# Patient Record
Sex: Female | Born: 1985
Health system: Southern US, Community
[De-identification: ages and names within clinical notes are randomized; demographics above are authoritative.]

## PROBLEM LIST (undated history)

## (undated) ENCOUNTER — Emergency Department (HOSPITAL_BASED_OUTPATIENT_CLINIC_OR_DEPARTMENT_OTHER): Admission: EM | Payer: Self-pay | Source: Home / Self Care

## (undated) DIAGNOSIS — Z8489 Family history of other specified conditions: Secondary | ICD-10-CM

## (undated) DIAGNOSIS — F32A Depression, unspecified: Secondary | ICD-10-CM

## (undated) DIAGNOSIS — F419 Anxiety disorder, unspecified: Secondary | ICD-10-CM

## (undated) DIAGNOSIS — Z9889 Other specified postprocedural states: Secondary | ICD-10-CM

## (undated) DIAGNOSIS — F329 Major depressive disorder, single episode, unspecified: Secondary | ICD-10-CM

## (undated) DIAGNOSIS — R112 Nausea with vomiting, unspecified: Secondary | ICD-10-CM

## (undated) HISTORY — PX: MANDIBLE FRACTURE SURGERY: SHX706

## (undated) HISTORY — DX: Major depressive disorder, single episode, unspecified: F32.9

## (undated) HISTORY — PX: KNEE SURGERY: SHX244

## (undated) HISTORY — DX: Depression, unspecified: F32.A

## (undated) HISTORY — PX: NOSE SURGERY: SHX723

---
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
## 2005-04-18 ENCOUNTER — Ambulatory Visit (HOSPITAL_BASED_OUTPATIENT_CLINIC_OR_DEPARTMENT_OTHER): Admission: RE | Admit: 2005-04-18 | Discharge: 2005-04-19 | Payer: Self-pay | Admitting: Unknown Physician Specialty

## 2006-08-19 ENCOUNTER — Emergency Department (HOSPITAL_COMMUNITY): Admission: EM | Admit: 2006-08-19 | Discharge: 2006-08-19 | Payer: Self-pay | Admitting: Emergency Medicine

## 2008-03-23 ENCOUNTER — Encounter: Admission: RE | Admit: 2008-03-23 | Discharge: 2008-03-23 | Payer: Self-pay | Admitting: Family Medicine
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
## 2009-10-07 ENCOUNTER — Other Ambulatory Visit: Admission: RE | Admit: 2009-10-07 | Discharge: 2009-10-07 | Payer: Self-pay | Admitting: Obstetrics and Gynecology

## 2010-04-19 ENCOUNTER — Inpatient Hospital Stay (HOSPITAL_COMMUNITY)
Admission: AD | Admit: 2010-04-19 | Discharge: 2010-04-19 | Disposition: A | Discharge status: Home / Self Care | Payer: BC Managed Care – PPO | Source: Ambulatory Visit | Attending: Obstetrics and Gynecology | Admitting: Obstetrics and Gynecology

## 2010-04-19 DIAGNOSIS — O479 False labor, unspecified: Secondary | ICD-10-CM | POA: Insufficient documentation

## 2010-04-27 ENCOUNTER — Inpatient Hospital Stay (HOSPITAL_COMMUNITY)
Admission: AD | Admit: 2010-04-27 | Discharge: 2010-04-29 | DRG: 373 | Disposition: A | Discharge status: Home / Self Care | Payer: BC Managed Care – PPO | Source: Ambulatory Visit | Attending: Obstetrics and Gynecology | Admitting: Obstetrics and Gynecology

## 2010-04-27 ENCOUNTER — Inpatient Hospital Stay (HOSPITAL_COMMUNITY)
Admission: AD | Admit: 2010-04-27 | Discharge: 2010-04-27 | Disposition: A | Discharge status: Home / Self Care | Payer: BC Managed Care – PPO | Source: Ambulatory Visit | Attending: Obstetrics and Gynecology | Admitting: Obstetrics and Gynecology

## 2010-04-27 DIAGNOSIS — O479 False labor, unspecified: Secondary | ICD-10-CM | POA: Insufficient documentation

## 2010-04-27 LAB — CBC
HCT: 37.3 % (ref 36.0–46.0)
Hemoglobin: 12.5 g/dL (ref 12.0–15.0)
WBC: 12.8 10*3/uL — ABNORMAL HIGH (ref 4.0–10.5)

## 2010-04-27 LAB — ABO/RH: ABO/RH(D): O POS

## 2010-04-28 LAB — CBC
HCT: 35.4 % — ABNORMAL LOW (ref 36.0–46.0)
Hemoglobin: 11.8 g/dL — ABNORMAL LOW (ref 12.0–15.0)
WBC: 14.9 10*3/uL — ABNORMAL HIGH (ref 4.0–10.5)

## 2010-06-03 NOTE — Op Note (Signed)
NAMEIMONIE, TUCH              ACCOUNT NO.:  [id number]   MEDICAL RECORD NO.:  [id number]          PATIENT TYPE:  AMB   LOCATION:  DSC                          FACILITY:  [facility]   PHYSICIAN:  Hermelinda Medicus, M.D.   DATE OF BIRTH:  [birth year]   DATE OF PROCEDURE:  DATE OF DISCHARGE:                                 OPERATIVE REPORT   PREOPERATIVE DIAGNOSIS:  Severe nasal deviation secondary to trauma with  septal deviation, with nasal obstruction 100% on the right and 50% on the  left, with severe columella deviation, with associated history of trauma.   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   OPERATION:  Nasal and septal reconstruction, turbinate reduction.   OPERATOR:  Dr. Hermelinda Medicus.   ANESTHESIA:  General endotracheal with Dr. Jean Rosenthal.   PROCEDURE:  Patient placed in the supine position under general orotracheal  anesthesia, was prepped and draped in the usual manner and using the usual  head drape. The topical cocaine 200 mg and 1% Xylocaine with epinephrine was  also used for hemostasis and for anesthesia purposes. A hemitransfixion  incision was made on the left side, carried around the columella to the  right side and a drooping columella was immediately approached and trimmed.  The quadrilateral cartilage was elevated back to the ethmoid septal  deviation and then a strip of posterior quadrilateral cartilage was taken as  well as off the floor of the nose as it was totally off the premaxillary  crest.  The ethmoid and vomerine septal deviation was pushed way over to the  right and the open and close Jansen-Middletons were used to address this  problem, leaving a full dorsal support the septum. Once this was brought  back to the midline and the septum was brought back to the midline,  the  columella and the tip of the nose began to come back to the midline toward  the right and we mobilized the columella. We then closed this  hemitransfixion incision which had been extended into a intercartilaginous  incision for our dorsal work and for fracturing the nose and taking down  this large dorsal hump.  The closure was with 5-0 plain catgut and a through-  and-through septal suture x2 using 4-0 plain. The inferior turbinates were  aggressively outfractured and the Elmed bipolar cautery was used to  cauterize the inferior turbinates to shrink these further.  Once this was  completed, then the dorsum was approached and the upper lateral cartilages  were exposed and trimmed of returning.  They were separated from the septum  as this was the only way we could bring the nose over to the midline and  this was completed and then the upper lateral cartilages were trimmed along  the dorsal aspect.  The 8-mm chisel was used to take down the dorsal hump  and to prepare to do our lateral osteotomies. These were rasped with normal  contour.  Then  the lateral osteotomies were completed using the 4 mm chisel  through pyriform incisions and the nasal bones were brought back to the  midline and also making the nose take a more normal appearance. Once this  was completed, the septum fit to the nose.  The nasal septum and the nose  was in the midline and then the closure was further completed with 5-0 plain  catgut of the intercartilaginous incisions.  The pyriforms were left open.  Intranasal dressing was placed.  Extra nasal cast was placed. The patient  tolerated the procedure very well and is doing well postoperatively. Follow-  up will be tomorrow, then on Friday, then three days, then in five days,  then in one week and then in three weeks and four weeks, six weeks and then  three months and six months and a year.       JC/MEDQ  D:  07/26/2004  T:  07/26/2004  Job:  [id number]   cc:    Marlin Canary, M.D.

## 2010-06-03 NOTE — Op Note (Signed)
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   PROCEDURE:  Bilateral sagittal osteotomies of the mandible with internal  screw fixation.   ANESTHESIA:  General.   SURGEON:  Hinton Dyer, D.D.S.   ASSISTANT:  Angelia Mould.  Montel Culver.   ESTIMATED BLOOD LOSS:  500 cc.   CONDITION END OF SURGERY:  Good.   DESCRIPTION OF PROCEDURE:  Following preoperative medication, the patient  was brought to the operating room in a supine position in which she remained  throughout the whole procedure.  She was intubated via a right nasal  endotracheal tube and then turned 90 degrees to the anesthesia cart.  She  was then prepped and draped in the usual fashion for an orthognathic  procedure.  After preparing the mouth with Betadine scrub and paint and  packing off the throat with an open moist 4 x 4 gauze, she was given  bilateral blocks using 2% Xylocaine with 1:100,000 epinephrine.  A #15 blade  then made an incision on the ascending ramus in the area of the third molar,  approximately 2.5 cm in length.  A full-thickness mucoperiosteal flap was  elevated with a periosteal elevator.  The medial aspect of the mandible was  freed up with a periosteal elevator.  The sigmoid notch was located with a  curved curet.  A reciprocating saw was then used to make a cut several  millimeters above the lingula, approximately halfway through the mandible.  A 701 cross cut  Fisher bur then made bur holes down the ascending ramus to  the first-second molar region.  These bur holes were then connected together  with the same bur.  Once this was finished, a periosteal elevator reflected  a flap down to the inferior border in the first-second molar region.  An  inferior border retractor was then placed around the inferior border and a  556 cross cut Fisher bur was then used to make a cut from the inferior  border to the original osteotomy site.  This osteotomy site was now widened  with increasing size hand osteotomes tapped into position and then a Smith  spread and Market researcher were used to separate the mandible.  The nerve was  found to be completely intact within the body of the segment containing the  teeth, now referred to as the distal segment.  A J stripper went along the  inferior border to free up the inferior border.  The condyle was checked and  found to be in the fossa.  The area was irrigated out and packed off.  The  left side was then addressed and #15 blade made an incision over the  ascending ramus, approximately 2.5 cm in length.  Again, a full-thickness  mucoperiosteal flap was elevated with a periosteal elevator.  The medial  aspect was reflected using a periosteal elevator and the sigmoid notch was  located with a curved curet.  A reciprocating saw made a cut 5 or 6 mm above  the lingula and halfway through the mandible.  A 701 cross cut Fisher bur  then made bur holes down the ascending ramus to the first-second molar  region and, like the right side, these bur holes were kept on the buccal  aspect.  The bur holes were then connected together.  A periosteal elevator  reflected a full-thickness flap down to the inferior border in the first-  second molar region and a 556 cross cut Fisher bur made a cut extending from  the inferior border to the original osteotomy site.  The osteotomy site was  then widened with increasing size hand  osteotomes and then the mandible was  split using a mid spread and elevator.  The nerve was found to be intact and  located within the distal segment of the mandible.  A J stripper reflected  the soft tissues around the inferior border so as to allow easy movement of  the mandible.  Once free, the mandible was placed in the Kerlix splint that  was made on the preoperative bottles and the occlusion was locked down into  position using 26-gauge stainless steel wires.  A large Allis clamp was then  placed on the patient's right side and the condyle was seated directly into  the fossa using posterior and superior pressure.  The Allis clamp was then  closed off.  Three screws were then placed into the bone by drilling the  holes first and then placing self-tapping screws.  The first two were 13 mm  in length and the third one was 11 mm in length.  All three were found to be  extremely tight.  When the Allis clamp was released, an attempt was made to  separate the mandible and it was found to be extremely tight.  It was also  irrigated out prior to placing the screws.  On the patient's left side, an  Allis clamp was placed and, again, the condyle was placed in a posterior  superior direction.  The Allis clamp was closed off and three screws were  placed, again drilling the hole first with copious irrigation and using self-  tapping screws.  Two 13-mm screws were placed anteriorly and an 11-mm screw  was placed posteriorly.  All three screws were found to be tight and the  mandible could not be separated when the Allis clamp was released.  The  occlusion was checked and found to be excellent.  Therefore, the splint was  not removed and the patient was placed in her occlusion and wires were used  to hold her in intermaxillary fixation.  Prior to placing her in  intermaxillary fixation, the throat pack was also removed and the throat was irrigated out.  Prior to placing her into intermaxillary  fixation, both  incisions were also closed with multiple 4-0 Vicryl sutures in a running  horizontal mattress fashion.  The patient tolerated the procedure well.  A  head drape and pressure dressing was placed and the patient was extubated on  the table and returned to the recovery room in good condition.  ______________________________  Hinton Dyer, D.D.S.     JLM/MEDQ  D:  05/03/2005  T:  05/04/2005  Job:  [id number]
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
## 2010-12-07 ENCOUNTER — Other Ambulatory Visit: Payer: Self-pay | Admitting: Family Medicine

## 2010-12-07 DIAGNOSIS — E049 Nontoxic goiter, unspecified: Secondary | ICD-10-CM

## 2010-12-14 ENCOUNTER — Ambulatory Visit
Admission: RE | Admit: 2010-12-14 | Discharge: 2010-12-14 | Disposition: A | Discharge status: Home / Self Care | Payer: BC Managed Care – PPO | Source: Ambulatory Visit | Attending: Family Medicine | Admitting: Family Medicine

## 2010-12-14 DIAGNOSIS — E049 Nontoxic goiter, unspecified: Secondary | ICD-10-CM

## 2010-12-20 ENCOUNTER — Other Ambulatory Visit: Payer: Self-pay | Admitting: Family Medicine

## 2010-12-20 DIAGNOSIS — E041 Nontoxic single thyroid nodule: Secondary | ICD-10-CM

## 2011-06-14 ENCOUNTER — Other Ambulatory Visit: Payer: BC Managed Care – PPO

## 2012-02-05 ENCOUNTER — Other Ambulatory Visit: Payer: Self-pay | Admitting: Family Medicine

## 2012-02-05 DIAGNOSIS — E041 Nontoxic single thyroid nodule: Secondary | ICD-10-CM

## 2012-02-08 ENCOUNTER — Other Ambulatory Visit: Payer: Self-pay

## 2012-02-08 ENCOUNTER — Ambulatory Visit
Admission: RE | Admit: 2012-02-08 | Discharge: 2012-02-08 | Disposition: A | Discharge status: Home / Self Care | Payer: BC Managed Care – PPO | Source: Ambulatory Visit | Attending: Family Medicine | Admitting: Family Medicine

## 2012-02-08 DIAGNOSIS — E041 Nontoxic single thyroid nodule: Secondary | ICD-10-CM

## 2012-05-15 ENCOUNTER — Other Ambulatory Visit (HOSPITAL_COMMUNITY)
Admission: RE | Admit: 2012-05-15 | Discharge: 2012-05-15 | Disposition: A | Discharge status: Home / Self Care | Payer: BC Managed Care – PPO | Source: Ambulatory Visit | Attending: Obstetrics and Gynecology | Admitting: Obstetrics and Gynecology

## 2012-05-15 ENCOUNTER — Other Ambulatory Visit: Payer: Self-pay | Admitting: Obstetrics and Gynecology

## 2012-05-15 DIAGNOSIS — Z01419 Encounter for gynecological examination (general) (routine) without abnormal findings: Secondary | ICD-10-CM | POA: Insufficient documentation

## 2014-05-29 ENCOUNTER — Other Ambulatory Visit: Payer: Self-pay | Admitting: Physician Assistant

## 2014-05-29 DIAGNOSIS — M799 Soft tissue disorder, unspecified: Secondary | ICD-10-CM

## 2014-06-09 ENCOUNTER — Ambulatory Visit
Admission: RE | Admit: 2014-06-09 | Discharge: 2014-06-09 | Disposition: A | Discharge status: Home / Self Care | Payer: BLUE CROSS/BLUE SHIELD | Source: Ambulatory Visit | Attending: Physician Assistant | Admitting: Physician Assistant

## 2014-06-09 DIAGNOSIS — M799 Soft tissue disorder, unspecified: Secondary | ICD-10-CM

## 2014-09-22 ENCOUNTER — Emergency Department (HOSPITAL_BASED_OUTPATIENT_CLINIC_OR_DEPARTMENT_OTHER): Payer: BLUE CROSS/BLUE SHIELD

## 2014-09-22 ENCOUNTER — Emergency Department (HOSPITAL_BASED_OUTPATIENT_CLINIC_OR_DEPARTMENT_OTHER)
Admission: EM | Admit: 2014-09-22 | Discharge: 2014-09-22 | Disposition: A | Discharge status: Home / Self Care | Payer: BLUE CROSS/BLUE SHIELD | Source: Home / Self Care | Attending: Emergency Medicine | Admitting: Emergency Medicine

## 2014-09-22 ENCOUNTER — Encounter (HOSPITAL_BASED_OUTPATIENT_CLINIC_OR_DEPARTMENT_OTHER): Payer: Self-pay

## 2014-09-22 DIAGNOSIS — L03115 Cellulitis of right lower limb: Secondary | ICD-10-CM | POA: Diagnosis not present

## 2014-09-22 DIAGNOSIS — M7989 Other specified soft tissue disorders: Secondary | ICD-10-CM | POA: Diagnosis not present

## 2014-09-22 DIAGNOSIS — L02415 Cutaneous abscess of right lower limb: Secondary | ICD-10-CM

## 2014-09-22 DIAGNOSIS — M79604 Pain in right leg: Secondary | ICD-10-CM

## 2014-09-22 LAB — CBC WITH DIFFERENTIAL/PLATELET
BASOS ABS: 0 10*3/uL (ref 0.0–0.1)
BASOS PCT: 0 % (ref 0–1)
Eosinophils Absolute: 0.1 10*3/uL (ref 0.0–0.7)
Eosinophils Relative: 1 % (ref 0–5)
HEMATOCRIT: 42.9 % (ref 36.0–46.0)
HEMOGLOBIN: 14.6 g/dL (ref 12.0–15.0)
Lymphocytes Relative: 16 % (ref 12–46)
Lymphs Abs: 2 10*3/uL (ref 0.7–4.0)
MCH: 31 pg (ref 26.0–34.0)
MCHC: 34 g/dL (ref 30.0–36.0)
MCV: 91.1 fL (ref 78.0–100.0)
MONO ABS: 0.9 10*3/uL (ref 0.1–1.0)
Monocytes Relative: 7 % (ref 3–12)
NEUTROS ABS: 9.9 10*3/uL — AB (ref 1.7–7.7)
NEUTROS PCT: 76 % (ref 43–77)
Platelets: 177 10*3/uL (ref 150–400)
RBC: 4.71 MIL/uL (ref 3.87–5.11)
RDW: 11.8 % (ref 11.5–15.5)
WBC: 12.9 10*3/uL — AB (ref 4.0–10.5)

## 2014-09-22 LAB — COMPREHENSIVE METABOLIC PANEL
ALBUMIN: 4.6 g/dL (ref 3.5–5.0)
ALT: 15 U/L (ref 14–54)
AST: 22 U/L (ref 15–41)
Alkaline Phosphatase: 51 U/L (ref 38–126)
Anion gap: 9 (ref 5–15)
BILIRUBIN TOTAL: 0.7 mg/dL (ref 0.3–1.2)
BUN: 7 mg/dL (ref 6–20)
CO2: 28 mmol/L (ref 22–32)
Calcium: 9.4 mg/dL (ref 8.9–10.3)
Chloride: 103 mmol/L (ref 101–111)
Creatinine, Ser: 0.7 mg/dL (ref 0.44–1.00)
GFR calc Af Amer: >60 mL/min (ref >60)
GFR calc non Af Amer: >60 mL/min (ref >60)
GLUCOSE: 91 mg/dL (ref 65–99)
POTASSIUM: 4 mmol/L (ref 3.5–5.1)
Sodium: 140 mmol/L (ref 135–145)
TOTAL PROTEIN: 8.2 g/dL — AB (ref 6.5–8.1)

## 2014-09-22 LAB — C-REACTIVE PROTEIN: CRP: 1.4 mg/dL — ABNORMAL HIGH (ref <1.0)

## 2014-09-22 LAB — SEDIMENTATION RATE: Sed Rate: 5 mm/hr (ref 0–22)

## 2014-09-22 MED ORDER — HYDROCODONE-ACETAMINOPHEN 5-325 MG PO TABS: 1.0000 | ORAL_TABLET | ORAL | Status: DC | PRN

## 2014-09-22 MED ORDER — CLINDAMYCIN HCL 150 MG PO CAPS: 150.0000 mg | ORAL_CAPSULE | Freq: Four times a day (QID) | ORAL | Status: DC

## 2014-09-22 MED ORDER — CLINDAMYCIN HCL 150 MG PO CAPS: 300.0000 mg | ORAL_CAPSULE | Freq: Four times a day (QID) | ORAL | Status: DC

## 2014-09-22 MED ORDER — HYDROMORPHONE HCL 1 MG/ML IJ SOLN
1.0000 mg | Freq: Once | INTRAMUSCULAR | Status: AC
  Administered 2014-09-22: 1 mg via INTRAVENOUS
  Filled 2014-09-22: qty 1

## 2014-09-22 MED ORDER — IOHEXOL 300 MG/ML  SOLN
100.0000 mL | Freq: Once | INTRAMUSCULAR | Status: AC | PRN
  Administered 2014-09-22: 100 mL via INTRAVENOUS

## 2014-09-22 MED ORDER — NAPROXEN 500 MG PO TABS: 500.0000 mg | ORAL_TABLET | Freq: Two times a day (BID) | ORAL | Status: DC

## 2014-09-22 MED ORDER — LIDOCAINE HCL (PF) 1 % IJ SOLN
15.0000 mL | Freq: Once | INTRAMUSCULAR | Status: DC
  Filled 2014-09-22: qty 15

## 2014-09-22 MED ORDER — HYDROMORPHONE HCL 1 MG/ML IJ SOLN
INTRAMUSCULAR | Status: DC
Start: 2014-09-22 — End: 2014-09-22
  Filled 2014-09-22: qty 1

## 2014-09-22 MED ORDER — SODIUM CHLORIDE 0.9 % IV BOLUS (SEPSIS)
1000.0000 mL | Freq: Once | INTRAVENOUS | Status: AC
  Administered 2014-09-22: 1000 mL via INTRAVENOUS

## 2014-09-22 MED ORDER — HYDROMORPHONE HCL 1 MG/ML IJ SOLN
1.0000 mg | Freq: Once | INTRAMUSCULAR | Status: AC
  Administered 2014-09-22: 1 mg via INTRAVENOUS

## 2014-09-22 MED ORDER — MORPHINE SULFATE (PF) 4 MG/ML IV SOLN
4.0000 mg | Freq: Once | INTRAVENOUS | Status: AC
  Administered 2014-09-22: 4 mg via INTRAVENOUS
  Filled 2014-09-22: qty 1

## 2014-09-22 MED ORDER — ONDANSETRON HCL 4 MG/2ML IJ SOLN
4.0000 mg | Freq: Once | INTRAMUSCULAR | Status: AC
  Administered 2014-09-22: 4 mg via INTRAVENOUS
  Filled 2014-09-22: qty 2

## 2014-09-22 MED ORDER — HYDROMORPHONE HCL 1 MG/ML IJ SOLN
1.0000 mg | Freq: Once | INTRAMUSCULAR | Status: AC
Start: 2014-09-22 — End: 2014-09-22
  Administered 2014-09-22: 1 mg via INTRAVENOUS
  Filled 2014-09-22: qty 1

## 2014-09-22 MED ORDER — OXYCODONE-ACETAMINOPHEN 5-325 MG PO TABS: 2.0000 | ORAL_TABLET | ORAL | Status: DC | PRN

## 2014-09-22 NOTE — Discharge Instructions (Signed)

## 2014-09-22 NOTE — ED Notes (Signed)
Borders of abscess marked

## 2014-09-22 NOTE — ED Notes (Signed)
Patient transported to X-ray 

## 2014-09-22 NOTE — ED Provider Notes (Signed)
CSN: [id number]     Arrival date & time 09/22/14  1118 History   First MD Initiated Contact with Patient 09/22/14 1247     Chief Complaint  Patient presents with  . Cellulitis     (Consider location/radiation/quality/duration/timing/severity/associated sxs/prior Treatment) HPI Comments: Was on doxy, rifampin, last on abx 1.5 wk ago Not sure how it initially began ?bug bite, 1 mos ago Improved prior to last night Then last night suddenly pain much more severe, worse than childbirth, spreading pain down leg and to knee, every 30sec kinife jabbing   Patient is a 29 y.o. female presenting with abscess.  Abscess Location:  Leg Leg abscess location:  R leg and R lower leg Size:  4cm Abscess quality: fluctuance, induration, painful, redness and warmth   Red streaking: yes   Duration: began 1 mo ago, was on abx then improved, last abx 1.5wk ago, worsened late last night, new redness and pain. Progression:  Worsening Pain details:    Quality:  Sharp   Severity:  Severe   Duration:  1 day   Timing:  Constant   Progression:  Worsening Chronicity:  New Context: not diabetes, not immunosuppression and not injected drug use   Relieved by:  Nothing Worsened by:  Nothing tried Ineffective treatments:  None tried Associated symptoms: nausea   Associated symptoms: no fever, no headaches and no vomiting   Risk factors: family hx of MRSA     History reviewed. No pertinent past medical history. Past Surgical History  Procedure Laterality Date  . Knee surgery    . Mandible fracture surgery    . Nose surgery     No family history on file. Social History  Substance Use Topics  . Smoking status: Current Every Day Smoker  . Smokeless tobacco: None  . Alcohol Use: No   OB History    No data available     Review of Systems  Constitutional: Negative for fever and chills.  HENT: Negative for sore throat.   Eyes: Negative for visual disturbance.  Respiratory: Negative for cough and  shortness of breath.   Cardiovascular: Negative for chest pain.  Gastrointestinal: Positive for nausea. Negative for vomiting, abdominal pain and constipation.  Genitourinary: Negative for difficulty urinating.  Musculoskeletal: Positive for myalgias and arthralgias. Negative for back pain and neck pain.  Skin: Negative for rash.  Neurological: Negative for syncope and headaches.      Allergies  Vicodin  Home Medications   Prior to Admission medications   Medication Sig Start Date End Date Taking? Authorizing Provider  clindamycin (CLEOCIN) 150 MG capsule Take 2 capsules (300 mg total) by mouth 4 (four) times daily. 09/22/14   Gareth Morgan, MD  HYDROcodone-acetaminophen (NORCO/VICODIN) 5-325 MG per tablet Take 1-2 tablets by mouth every 4 (four) hours as needed. 09/22/14   Gareth Morgan, MD  naproxen (NAPROSYN) 500 MG tablet Take 1 tablet (500 mg total) by mouth 2 (two) times daily. 09/22/14   Gareth Morgan, MD  oxyCODONE-acetaminophen (PERCOCET/ROXICET) 5-325 MG per tablet Take 2 tablets by mouth every 4 (four) hours as needed for severe pain. 09/22/14   Gareth Morgan, MD   BP 106/60 mmHg  Pulse 89  Temp(Src) 99.6 F (37.6 C) (Oral)  Resp 16  Ht _0  (1.651 m)  Wt 140 lb (63.504 kg)  BMI 23.30 kg/m2  SpO2 94% Physical Exam  Constitutional: She is oriented to person, place, and time. She appears well-developed and well-nourished. No distress.  HENT:  Head: Normocephalic and atraumatic.  Eyes: Conjunctivae and EOM are normal.  Neck: Normal range of motion.  Cardiovascular: Normal rate, regular rhythm, normal heart sounds and intact distal pulses.  Exam reveals no gallop and no friction rub.   No murmur heard. Pulmonary/Chest: Effort normal and breath sounds normal. No respiratory distress. She has no wheezes. She has no rales.  Abdominal: Soft. She exhibits no distension. There is no tenderness. There is no guarding.  Musculoskeletal: She exhibits no edema.       Right  lower leg: She exhibits tenderness and bony tenderness.  4cm area of erythema anterior medial tibia, central area of necrosis/scabbing No bullae No crepitance Extending area of tenderness inferiorly towards ankle   Neurological: She is alert and oriented to person, place, and time.  Skin: Skin is warm and dry. No rash noted. She is not diaphoretic. No erythema.  Nursing note and vitals reviewed.   ED Course  INCISION AND DRAINAGE Date/Time: 09/22/2014 6:19 PM Performed by: Gareth Morgan Authorized by: Gareth Morgan Consent: Verbal consent obtained. Risks and benefits: risks, benefits and alternatives were discussed Consent given by: patient Patient understanding: patient states understanding of the procedure being performed Patient consent: the patient's understanding of the procedure matches consent given Procedure consent: procedure consent matches procedure scheduled Relevant documents: relevant documents present and verified Test results: test results available and properly labeled Site marked: the operative site was marked Imaging studies: imaging studies available Required items: required blood products, implants, devices, and special equipment available Patient identity confirmed: verbally with patient Time out: Immediately prior to procedure a "time out" was called to verify the correct patient, procedure, equipment, support staff and site/side marked as required. Type: abscess Body area: lower extremity Location details: right leg Anesthesia: local infiltration Local anesthetic: lidocaine 1% with epinephrine Anesthetic total: 10 ml Patient sedated: no Scalpel size: 11 Incision type: single straight Incision depth: dermal Complexity: simple Drainage: purulent,  serous and  bloody Drainage amount: moderate Wound treatment: wound left open Packing material: 1/2 in iodoform gauze (1in iodoform) Patient tolerance: Patient tolerated the procedure well with no  immediate complications   (including critical care time) Labs Review Labs Reviewed  CBC WITH DIFFERENTIAL/PLATELET - Abnormal; Notable for the following:    WBC 12.9 (*)    Neutro Abs 9.9 (*)    All other components within normal limits  COMPREHENSIVE METABOLIC PANEL - Abnormal; Notable for the following:    Total Protein 8.2 (*)    All other components within normal limits  SEDIMENTATION RATE  C-REACTIVE PROTEIN    Imaging Review Dg Tibia/fibula Right  09/22/2014   CLINICAL DATA:  Continued pain and swelling about the right lower leg just below the knee since a insect bite 2 weeks ago. Initial encounter.  EXAM: RIGHT TIBIA AND FIBULA - 2 VIEW  COMPARISON:  None.  FINDINGS: No radiopaque foreign body or soft tissue gas collection is identified. Soft tissues are unremarkable. All imaged bones and joints appear normal.  IMPRESSION: Negative exam.   Electronically Signed   By: Inge Rise M.D.   On: 09/22/2014 13:57   Ct Tibia Fibula Right W Contrast  09/22/2014   CLINICAL DATA:  Insect bite on the upper leg 1 month ago. Worsening symptoms following antibiotic therapy. Pain extending down the leg.  EXAM: CT OF THE LOWER RIGHT EXTREMITY WITH CONTRAST  TECHNIQUE: Multidetector CT imaging of the RIGHT leg was performed according to the standard protocol following intravenous contrast administration.  COMPARISON:  09/22/2014.  CONTRAST:  168m OMNIPAQUE  IOHEXOL 300 MG/ML  SOLN  FINDINGS: A cutaneous abscess is present extending into the subcutaneous tissues of the proximal RIGHT leg. This is medial and anterior to the proximal tibial metaphysis and is probably palpable. This is in the area identified with cutaneous marker. The abscess measures 13 mm AP, 29 mm transverse and 42 mm craniocaudal. This demonstrates peripheral enhancement with central low attenuation, typical of abscess. No gas is present. Surrounding cellulitis is present. Neurovascular bundles are within normal limits. Negative for  osteomyelitis. Flexor and extensor tendons at the ankle appear normal. Marrow signal appears normal. Visible portions of the RIGHT knee are within normal limits.  IMPRESSION: Superficial proximal RIGHT leg medial cutaneous and subcutaneous abscess.   Electronically Signed   By: Dereck Ligas M.D.   On: 09/22/2014 16:30   I have personally reviewed and evaluated these images and lab results as part of my medical decision-making.   EKG Interpretation None      MDM   Final diagnoses:  Right leg pain  Cellulitis of right lower extremity  Abscess of right lower extremity   29 year old female with history of ongoing cellulitis over last month, with waxing and waning and about X, presents with onset of sudden increase worsening of her right lower extremity pain last night, with new worsening of cellulitis, and development of fluctuance.  Given patient's significant area of pain which was out of proportion to exam, pain and tenderness in distribution beyond area of cellulitis, CBC, CMP, ESR and CRP were ordered to evaluate for necrotizing fasciitis. Patient had mild leukocytosis of 12.9, normal CMP including normal sodium, normal creatinine, normal ESR of 5.  X-ray of the right X and he did not really feel any abnormalities, however given patient's pain, CT of her right tibia and fibula was ordered. CT did not reveal any signs of subcutaneous gas, with no CT evidence of necrotizing fasciitis or osteomyelitis. CT did show an abscess. Given patient's normal vital signs, and normal laboratory values and normal CT scan low suspicion this represents necrotizing fasciitis. Patient with subacute abscess which was incised and drained with moderate amount of purulence and blood expressed and wound was packed. Recommend 48 hour follow-up for wound check either with PCP or urgent care ED. Patient was given Percocet and naproxen for pain, and 10 days of clindamycin for surrounding cellulitis. Patient discharged in  stable condition with understanding of reasons to return.     Gareth Morgan, MD 09/22/14 1820

## 2014-09-22 NOTE — ED Notes (Signed)
MD at bedside. 

## 2014-09-22 NOTE — ED Notes (Signed)
Pt states she she had ?insect bite to right LE approx 1 month ago-she has been seen by PCP with 3 rounds of abx-last ended approx 1.5 week ago-area is now with increase redness with dark center since yesterday

## 2014-09-22 NOTE — ED Notes (Signed)
Patient transported to CT 

## 2014-09-23 ENCOUNTER — Encounter (HOSPITAL_COMMUNITY): Payer: Self-pay | Admitting: *Deleted

## 2014-09-23 ENCOUNTER — Inpatient Hospital Stay (HOSPITAL_COMMUNITY)
Admission: EM | Admit: 2014-09-23 | Discharge: 2014-09-26 | DRG: 603 | Disposition: A | Discharge status: Home / Self Care | Payer: BLUE CROSS/BLUE SHIELD | Attending: Internal Medicine | Admitting: Internal Medicine

## 2014-09-23 DIAGNOSIS — Z79891 Long term (current) use of opiate analgesic: Secondary | ICD-10-CM

## 2014-09-23 DIAGNOSIS — Z885 Allergy status to narcotic agent status: Secondary | ICD-10-CM | POA: Diagnosis not present

## 2014-09-23 DIAGNOSIS — L03115 Cellulitis of right lower limb: Principal | ICD-10-CM | POA: Diagnosis present

## 2014-09-23 DIAGNOSIS — F17210 Nicotine dependence, cigarettes, uncomplicated: Secondary | ICD-10-CM | POA: Diagnosis present

## 2014-09-23 DIAGNOSIS — R630 Anorexia: Secondary | ICD-10-CM | POA: Diagnosis present

## 2014-09-23 DIAGNOSIS — L02419 Cutaneous abscess of limb, unspecified: Secondary | ICD-10-CM | POA: Diagnosis not present

## 2014-09-23 DIAGNOSIS — L02415 Cutaneous abscess of right lower limb: Secondary | ICD-10-CM | POA: Diagnosis present

## 2014-09-23 DIAGNOSIS — Z791 Long term (current) use of non-steroidal anti-inflammatories (NSAID): Secondary | ICD-10-CM | POA: Diagnosis not present

## 2014-09-23 DIAGNOSIS — D696 Thrombocytopenia, unspecified: Secondary | ICD-10-CM | POA: Diagnosis present

## 2014-09-23 DIAGNOSIS — G43909 Migraine, unspecified, not intractable, without status migrainosus: Secondary | ICD-10-CM | POA: Diagnosis present

## 2014-09-23 DIAGNOSIS — Z79899 Other long term (current) drug therapy: Secondary | ICD-10-CM | POA: Diagnosis not present

## 2014-09-23 DIAGNOSIS — M7989 Other specified soft tissue disorders: Secondary | ICD-10-CM | POA: Diagnosis present

## 2014-09-23 DIAGNOSIS — K5900 Constipation, unspecified: Secondary | ICD-10-CM | POA: Diagnosis present

## 2014-09-23 DIAGNOSIS — L03119 Cellulitis of unspecified part of limb: Secondary | ICD-10-CM | POA: Diagnosis not present

## 2014-09-23 DIAGNOSIS — A4901 Methicillin susceptible Staphylococcus aureus infection, unspecified site: Secondary | ICD-10-CM | POA: Diagnosis present

## 2014-09-23 DIAGNOSIS — A419 Sepsis, unspecified organism: Secondary | ICD-10-CM | POA: Diagnosis not present

## 2014-09-23 DIAGNOSIS — B9561 Methicillin susceptible Staphylococcus aureus infection as the cause of diseases classified elsewhere: Secondary | ICD-10-CM | POA: Diagnosis not present

## 2014-09-23 LAB — CBC WITH DIFFERENTIAL/PLATELET
Basophils Absolute: 0 10*3/uL (ref 0.0–0.1)
Basophils Relative: 0 % (ref 0–1)
EOS PCT: 0 % (ref 0–5)
Eosinophils Absolute: 0 10*3/uL (ref 0.0–0.7)
HCT: 40.8 % (ref 36.0–46.0)
Hemoglobin: 14 g/dL (ref 12.0–15.0)
LYMPHS ABS: 1 10*3/uL (ref 0.7–4.0)
LYMPHS PCT: 10 % — AB (ref 12–46)
MCH: 31.7 pg (ref 26.0–34.0)
MCHC: 34.3 g/dL (ref 30.0–36.0)
MCV: 92.3 fL (ref 78.0–100.0)
MONO ABS: 0.7 10*3/uL (ref 0.1–1.0)
MONOS PCT: 7 % (ref 3–12)
Neutro Abs: 8.1 10*3/uL — ABNORMAL HIGH (ref 1.7–7.7)
Neutrophils Relative %: 83 % — ABNORMAL HIGH (ref 43–77)
PLATELETS: 139 10*3/uL — AB (ref 150–400)
RBC: 4.42 MIL/uL (ref 3.87–5.11)
RDW: 12 % (ref 11.5–15.5)
WBC: 9.8 10*3/uL (ref 4.0–10.5)

## 2014-09-23 LAB — COMPREHENSIVE METABOLIC PANEL
ALT: 21 U/L (ref 14–54)
AST: 25 U/L (ref 15–41)
Albumin: 4.1 g/dL (ref 3.5–5.0)
Alkaline Phosphatase: 54 U/L (ref 38–126)
Anion gap: 7 (ref 5–15)
BUN: 5 mg/dL — ABNORMAL LOW (ref 6–20)
CHLORIDE: 100 mmol/L — AB (ref 101–111)
CO2: 29 mmol/L (ref 22–32)
CREATININE: 0.73 mg/dL (ref 0.44–1.00)
Calcium: 9.2 mg/dL (ref 8.9–10.3)
Glucose, Bld: 103 mg/dL — ABNORMAL HIGH (ref 65–99)
POTASSIUM: 4.5 mmol/L (ref 3.5–5.1)
Sodium: 136 mmol/L (ref 135–145)
Total Bilirubin: 0.8 mg/dL (ref 0.3–1.2)
Total Protein: 8 g/dL (ref 6.5–8.1)

## 2014-09-23 LAB — CREATININE, SERUM
CREATININE: 0.72 mg/dL (ref 0.44–1.00)
GFR calc non Af Amer: >60 mL/min (ref >60)

## 2014-09-23 LAB — SEDIMENTATION RATE: SED RATE: 26 mm/h — AB (ref 0–22)

## 2014-09-23 MED ORDER — MORPHINE SULFATE (PF) 4 MG/ML IV SOLN
INTRAVENOUS | Status: AC
  Filled 2014-09-23: qty 1

## 2014-09-23 MED ORDER — ACETAMINOPHEN 650 MG RE SUPP: 650.0000 mg | Freq: Four times a day (QID) | RECTAL | Status: DC | PRN

## 2014-09-23 MED ORDER — ONDANSETRON HCL 4 MG PO TABS: 4.0000 mg | ORAL_TABLET | Freq: Four times a day (QID) | ORAL | Status: DC | PRN

## 2014-09-23 MED ORDER — MORPHINE SULFATE (PF) 4 MG/ML IV SOLN
4.0000 mg | Freq: Once | INTRAVENOUS | Status: AC
  Administered 2014-09-23: 4 mg via INTRAVENOUS
  Filled 2014-09-23: qty 1

## 2014-09-23 MED ORDER — ONDANSETRON HCL 4 MG/2ML IJ SOLN: 4.0000 mg | Freq: Four times a day (QID) | INTRAMUSCULAR | Status: DC | PRN

## 2014-09-23 MED ORDER — ALUM & MAG HYDROXIDE-SIMETH 200-200-20 MG/5ML PO SUSP: 30.0000 mL | Freq: Four times a day (QID) | ORAL | Status: DC | PRN

## 2014-09-23 MED ORDER — VANCOMYCIN HCL IN DEXTROSE 750-5 MG/150ML-% IV SOLN
750.0000 mg | Freq: Three times a day (TID) | INTRAVENOUS | Status: DC
  Administered 2014-09-24 – 2014-09-26 (×7): 750 mg via INTRAVENOUS
  Filled 2014-09-23 (×9): qty 150

## 2014-09-23 MED ORDER — PIPERACILLIN-TAZOBACTAM 3.375 G IVPB
3.3750 g | Freq: Three times a day (TID) | INTRAVENOUS | Status: DC
  Administered 2014-09-24 – 2014-09-25 (×4): 3.375 g via INTRAVENOUS
  Filled 2014-09-23 (×6): qty 50

## 2014-09-23 MED ORDER — DIPHENHYDRAMINE HCL 50 MG/ML IJ SOLN
25.0000 mg | Freq: Once | INTRAMUSCULAR | Status: AC
  Administered 2014-09-24: 25 mg via INTRAVENOUS
  Filled 2014-09-23: qty 1

## 2014-09-23 MED ORDER — SODIUM CHLORIDE 0.9 % IV BOLUS (SEPSIS)
1000.0000 mL | Freq: Once | INTRAVENOUS | Status: AC
  Administered 2014-09-23: 1000 mL via INTRAVENOUS

## 2014-09-23 MED ORDER — ENOXAPARIN SODIUM 40 MG/0.4ML ~~LOC~~ SOLN
40.0000 mg | SUBCUTANEOUS | Status: DC
  Administered 2014-09-23 – 2014-09-25 (×3): 40 mg via SUBCUTANEOUS
  Filled 2014-09-23 (×4): qty 0.4

## 2014-09-23 MED ORDER — VANCOMYCIN HCL IN DEXTROSE 1-5 GM/200ML-% IV SOLN
1000.0000 mg | Freq: Once | INTRAVENOUS | Status: AC
  Administered 2014-09-23: 1000 mg via INTRAVENOUS
  Filled 2014-09-23: qty 200

## 2014-09-23 MED ORDER — HYDROMORPHONE HCL 1 MG/ML IJ SOLN: 1.0000 mg | INTRAMUSCULAR | Status: DC | PRN

## 2014-09-23 MED ORDER — ONDANSETRON HCL 4 MG/2ML IJ SOLN
4.0000 mg | Freq: Once | INTRAMUSCULAR | Status: AC
  Administered 2014-09-23: 4 mg via INTRAVENOUS
  Filled 2014-09-23: qty 2

## 2014-09-23 MED ORDER — FAMOTIDINE IN NACL 20-0.9 MG/50ML-% IV SOLN
20.0000 mg | Freq: Once | INTRAVENOUS | Status: AC
  Administered 2014-09-23: 20 mg via INTRAVENOUS
  Filled 2014-09-23: qty 50

## 2014-09-23 MED ORDER — DIPHENHYDRAMINE HCL 50 MG/ML IJ SOLN
INTRAMUSCULAR | Status: AC
  Administered 2014-09-23: 21:00:00
  Filled 2014-09-23: qty 1

## 2014-09-23 MED ORDER — HYDROMORPHONE HCL 1 MG/ML IJ SOLN
1.0000 mg | Freq: Once | INTRAMUSCULAR | Status: AC
  Administered 2014-09-23: 1 mg via INTRAVENOUS
  Filled 2014-09-23: qty 1

## 2014-09-23 MED ORDER — METHYLPREDNISOLONE SODIUM SUCC 125 MG IJ SOLR
125.0000 mg | Freq: Once | INTRAMUSCULAR | Status: AC
  Administered 2014-09-23: 125 mg via INTRAVENOUS
  Filled 2014-09-23: qty 2

## 2014-09-23 MED ORDER — ELETRIPTAN HYDROBROMIDE 40 MG PO TABS
40.0000 mg | ORAL_TABLET | ORAL | Status: DC | PRN
  Filled 2014-09-23: qty 1

## 2014-09-23 MED ORDER — PIPERACILLIN-TAZOBACTAM 3.375 G IVPB 30 MIN
3.3750 g | Freq: Once | INTRAVENOUS | Status: AC
  Administered 2014-09-23: 3.375 g via INTRAVENOUS

## 2014-09-23 MED ORDER — ACETAMINOPHEN 325 MG PO TABS
650.0000 mg | ORAL_TABLET | Freq: Four times a day (QID) | ORAL | Status: DC | PRN
  Administered 2014-09-24 – 2014-09-26 (×4): 650 mg via ORAL
  Filled 2014-09-23 (×4): qty 2

## 2014-09-23 MED ORDER — OXYCODONE HCL 5 MG PO TABS
5.0000 mg | ORAL_TABLET | ORAL | Status: DC | PRN
  Administered 2014-09-24 (×3): 5 mg via ORAL
  Filled 2014-09-23 (×3): qty 1

## 2014-09-23 NOTE — Progress Notes (Signed)
Pt confirms pcp is wendy mcneil [facility] updated

## 2014-09-23 NOTE — ED Notes (Signed)
Pt states "seen at [facility] yesterday, it was opened.  I feel like I have the flu.  I've been on abx for a month.  Redness got worse after it was opened."  Pt presents with RLE bandaged.

## 2014-09-23 NOTE — ED Provider Notes (Signed)
CSN: [id number]     Arrival date & time 09/23/14  1543 History   First MD Initiated Contact with Patient 09/23/14 1641     Chief Complaint  Patient presents with  . Wound Infection     (Consider location/radiation/quality/duration/timing/severity/associated sxs/prior Treatment) HPI Margaret Woods is a 29 y.o. female who comes in for evaluation of wound infection. Patient states approximately one month ago she was bitten by an insect, and has had a progressive cellulitis over the front of her right shin. She was seen in the ED yesterday, had an IND done for an abscess and discharge with oral antibiotics (clindamycin). Patient states she has been taking the antibiotics, but the redness and swelling have progressed further down in upper leg. Worsening knifelike pain. She reports associated mild fevers at home of 100F. She also reports she feels like she has the flu with generalized body aches, nasal congestion and cough. She does report neck stiffness, specifically on the sides of her neck. Rates discomfort as severe. No other aggravating or modifying factors.  History reviewed. No pertinent past medical history. Past Surgical History  Procedure Laterality Date  . Knee surgery    . Mandible fracture surgery    . Nose surgery     No family history on file. Social History  Substance Use Topics  . Smoking status: Current Every Day Smoker  . Smokeless tobacco: None  . Alcohol Use: No   OB History    No data available     Review of Systems A 10 point review of systems was completed and was negative except for pertinent positives and negatives as mentioned in the history of present illness     Allergies  Vicodin  Home Medications   Prior to Admission medications   Medication Sig Start Date End Date Taking? Authorizing Provider  clindamycin (CLEOCIN) 150 MG capsule Take 2 capsules (300 mg total) by mouth 4 (four) times daily. 09/22/14  Yes Gareth Morgan, MD  eletriptan (RELPAX)  40 MG tablet Take 40 mg by mouth as needed for migraine or headache. May repeat in 2 hours if headache persists or recurs.   Yes Historical Provider, MD  oxyCODONE-acetaminophen (PERCOCET/ROXICET) 5-325 MG per tablet Take 2 tablets by mouth every 4 (four) hours as needed for severe pain. 09/22/14  Yes Gareth Morgan, MD  HYDROcodone-acetaminophen (NORCO/VICODIN) 5-325 MG per tablet Take 1-2 tablets by mouth every 4 (four) hours as needed. Patient not taking: Reported on 09/23/2014 09/22/14   Gareth Morgan, MD  naproxen (NAPROSYN) 500 MG tablet Take 1 tablet (500 mg total) by mouth 2 (two) times daily. 09/22/14   Gareth Morgan, MD   BP 110/71 mmHg  Pulse 91  Temp(Src) 98.7 F (37.1 C) (Oral)  Resp 18  Ht '5\' 5"'  (1.651 m)  Wt 140 lb (63.504 kg)  BMI 23.30 kg/m2  SpO2 94% Physical Exam  Constitutional: She is oriented to person, place, and time. She appears well-developed and well-nourished.  HENT:  Head: Normocephalic and atraumatic.  Mouth/Throat: Oropharynx is clear and moist.  Eyes: Conjunctivae are normal. Pupils are equal, round, and reactive to light. Right eye exhibits no discharge. Left eye exhibits no discharge. No scleral icterus.  Neck: Normal range of motion. Neck supple.  Patient does express discomfort on the bilateral sides of her neck, but no overt meningismus or nuchal rigidity.  Cardiovascular: Normal rate, regular rhythm and normal heart sounds.   Pulmonary/Chest: Effort normal and breath sounds normal. No respiratory distress. She has no wheezes.  She has no rales.  Abdominal: Soft. There is no tenderness.  Musculoskeletal: She exhibits no tenderness.  Neurological: She is alert and oriented to person, place, and time.  Cranial Nerves II-XII grossly intact  Skin: Skin is warm and dry. No rash noted.  Obvious progressive erythema from previously demarcated borders. Diffuse tenderness to calf and distal right thigh. No drainage coming from the wound, however there are  necrotic orders. See pictures for further detail.  Psychiatric: She has a normal mood and affect.  Nursing note and vitals reviewed.       ED Course  Procedures (including critical care time) Labs Review Labs Reviewed  CBC WITH DIFFERENTIAL/PLATELET - Abnormal; Notable for the following:    Platelets 139 (*)    Neutrophils Relative % 83 (*)    Neutro Abs 8.1 (*)    Lymphocytes Relative 10 (*)    All other components within normal limits  COMPREHENSIVE METABOLIC PANEL - Abnormal; Notable for the following:    Chloride 100 (*)    Glucose, Bld 103 (*)    BUN 5 (*)    All other components within normal limits  SEDIMENTATION RATE - Abnormal; Notable for the following:    Sed Rate 26 (*)    All other components within normal limits  WOUND CULTURE  WOUND CULTURE  CBC  CREATININE, SERUM  BASIC METABOLIC PANEL  CBC    Imaging Review Dg Tibia/fibula Right  09/22/2014   CLINICAL DATA:  Continued pain and swelling about the right lower leg just below the knee since a insect bite 2 weeks ago. Initial encounter.  EXAM: RIGHT TIBIA AND FIBULA - 2 VIEW  COMPARISON:  None.  FINDINGS: No radiopaque foreign body or soft tissue gas collection is identified. Soft tissues are unremarkable. All imaged bones and joints appear normal.  IMPRESSION: Negative exam.   Electronically Signed   By: Inge Rise M.D.   On: 09/22/2014 13:57   Ct Tibia Fibula Right W Contrast  09/22/2014   CLINICAL DATA:  Insect bite on the upper leg 1 month ago. Worsening symptoms following antibiotic therapy. Pain extending down the leg.  EXAM: CT OF THE LOWER RIGHT EXTREMITY WITH CONTRAST  TECHNIQUE: Multidetector CT imaging of the RIGHT leg was performed according to the standard protocol following intravenous contrast administration.  COMPARISON:  09/22/2014.  CONTRAST:  122m OMNIPAQUE IOHEXOL 300 MG/ML  SOLN  FINDINGS: A cutaneous abscess is present extending into the subcutaneous tissues of the proximal RIGHT leg.  This is medial and anterior to the proximal tibial metaphysis and is probably palpable. This is in the area identified with cutaneous marker. The abscess measures 13 mm AP, 29 mm transverse and 42 mm craniocaudal. This demonstrates peripheral enhancement with central low attenuation, typical of abscess. No gas is present. Surrounding cellulitis is present. Neurovascular bundles are within normal limits. Negative for osteomyelitis. Flexor and extensor tendons at the ankle appear normal. Marrow signal appears normal. Visible portions of the RIGHT knee are within normal limits.  IMPRESSION: Superficial proximal RIGHT leg medial cutaneous and subcutaneous abscess.   Electronically Signed   By: GDereck LigasM.D.   On: 09/22/2014 16:30   I have personally reviewed and evaluated these images and lab results as part of my medical decision-making.   EKG Interpretation None     Meds given in ED:  Medications  vancomycin (VANCOCIN) IVPB 1000 mg/200 mL premix (1,000 mg Intravenous New Bag/Given 09/23/14 1846)  enoxaparin (LOVENOX) injection 40 mg (not administered)  acetaminophen (TYLENOL) tablet 650 mg (not administered)    Or  acetaminophen (TYLENOL) suppository 650 mg (not administered)  ondansetron (ZOFRAN) tablet 4 mg (not administered)    Or  ondansetron (ZOFRAN) injection 4 mg (not administered)  alum & mag hydroxide-simeth (MAALOX/MYLANTA) 200-200-20 MG/5ML suspension 30 mL (not administered)  oxyCODONE (Oxy IR/ROXICODONE) immediate release tablet 5 mg (not administered)  HYDROmorphone (DILAUDID) injection 1 mg (not administered)  eletriptan (RELPAX) tablet 40 mg (not administered)  piperacillin-tazobactam (ZOSYN) IVPB 3.375 g (not administered)  ondansetron (ZOFRAN) injection 4 mg (4 mg Intravenous Given 09/23/14 1744)  sodium chloride 0.9 % bolus 1,000 mL (1,000 mLs Intravenous New Bag/Given 09/23/14 1744)  morphine 4 MG/ML injection 4 mg (4 mg Intravenous Given 09/23/14 1744)  morphine 4 MG/ML  injection (  Duplicate 02/19/13 9017)  HYDROmorphone (DILAUDID) injection 1 mg (1 mg Intravenous Given 09/23/14 1847)    New Prescriptions   No medications on file   Filed Vitals:   09/23/14 1619 09/23/14 1901  BP: 115/78 110/71  Pulse: 88 91  Temp: 98.7 F (37.1 C)   TempSrc: Oral   Resp:  18  Height: '5\' 5"'  (1.651 m)   Weight: 140 lb (63.504 kg)   SpO2: 98% 94%    MDM  Margaret Woods is a 29 y.o. female with no significant past medical history, no history of immunocompromise, comes in for evaluation of cellulitis refractory to outpatient clindamycin therapy. Wound culture was obtained. Basic labs including CBC, CMP, ESR obtained. Vital signs have remained stable, no leukocytosis. Discussed with hospitalist, Dr. Wynelle Cleveland to see in the ED. Admitted to medical service for IV antibiotics. Patient started on vancomycin therapy. Also discussed with my attending, Dr. Billy Fischer who also saw and evaluated the patient, will consult general surgery. Patient also started on Zosyn, per recommendations of general surgery. Final diagnoses:  Cellulitis and abscess of leg            Comer Locket, PA-C 09/23/14 1938  Gareth Morgan, MD 09/24/14 [phone]

## 2014-09-23 NOTE — ED Notes (Signed)
Initial dose of morphine was dropped on the floor. Dose was diluted in saline. Another dose pulled from the pyxis and asministered.

## 2014-09-23 NOTE — Progress Notes (Signed)
Utilization Review completed.  Gustin Zobrist RN CM  

## 2014-09-23 NOTE — ED Notes (Signed)
Second attempt for report, RN unavailable. Awaiting call

## 2014-09-23 NOTE — H&P (Addendum)
[facility] History and Physical  Margaret Woods ZOX:[id number] DOB: 05/16/1985 DOA: 09/23/2014   PCP: Gweneth Dimitri, MD    Chief Complaint: swelling and redness on right leg  HPI: Margaret Woods is a 29 y.o. female with h/o migraines who developed swelling and redness on her right leg about 3 wks ago. She was given 2 courses of Doxycycline 10 days each and a third course of Doxycycline and Rifampin for 7 days. The swelling and redness did not improve. About 2 days ago it began to increase in size. She was seen at the ER in [facility] yesterday, had the area lanced and was sent home with clindamycin. She states not much pus was removed and the area mostly drained blood. Overnight redness extended up to her knee and down to her ankle and she developed low-grade fevers and body aches and felt like she had the flu. She presented to the ER again for this reason. The packing is still in place but the ER PA states there was drainage from around the wound which she has cultured. The patient states that the pain is quite significant.   General: + anorexia, fever, no frequrent headaches  Cardiac: Denies chest pain, syncope, palpitations, pedal edema  Respiratory: Denies cough, shortness of breath, wheezing GI: Denies severe indigestion/heartburn, abdominal pain, nausea, vomiting, diarrhea and constipation GU: Denies hematuria, incontinence, dysuria  Musculoskeletal: right knee pain in association with current infection Skin: Denies suspicious skin lesions Neurologic: Denies focal weakness or numbness, change in vision Psychiatry: Denies depression or anxiety. Hematologic: no easy bruising or bleeding  All other systems reviewed and found to be negative.  History reviewed. No pertinent past medical history.  Past Surgical History  Procedure Laterality Date  . Knee surgery    . Mandible fracture surgery    . Nose surgery      Social History: Social History   Social  History  . Marital Status: Single    Spouse Name: N/A  . Number of Children: N/A  . Years of Education: N/A   Occupational History  . Not on file.   Social History Main Topics  . Smoking status: Current Every Day Smoker- less than 1/2 ppd- hasnt smoked in 2 days  . Smokeless tobacco: Not on file  . Alcohol Use: No  . Drug Use: No  . Sexual Activity: Yes    Birth Control/ Protection: IUD   Other Topics Concern  . Not on file   Social History Narrative     Allergies  Allergen Reactions  . Vicodin [Hydrocodone-Acetaminophen] Other (See Comments)    "It makes me paranoid and angry"    Family history:  No medical problems in family.     Prior to Admission medications   Medication Sig Start Date End Date Taking? Authorizing Provider  clindamycin (CLEOCIN) 150 MG capsule Take 2 capsules (300 mg total) by mouth 4 (four) times daily. 09/22/14  Yes Alvira Monday, MD  eletriptan (RELPAX) 40 MG tablet Take 40 mg by mouth as needed for migraine or headache. May repeat in 2 hours if headache persists or recurs.   Yes Historical Provider, MD  oxyCODONE-acetaminophen (PERCOCET/ROXICET) 5-325 MG per tablet Take 2 tablets by mouth every 4 (four) hours as needed for severe pain. 09/22/14  Yes Alvira Monday, MD  HYDROcodone-acetaminophen (NORCO/VICODIN) 5-325 MG per tablet Take 1-2 tablets by mouth every 4 (four) hours as needed. Patient not taking: Reported on 09/23/2014 09/22/14   Alvira Monday, MD  naproxen (NAPROSYN)  500 MG tablet Take 1 tablet (500 mg total) by mouth 2 (two) times daily. 09/22/14   Alvira Monday, MD     Physical Exam: Filed Vitals:   09/23/14 1619  BP: 115/78  Pulse: 88  Temp: 98.7 F (37.1 C)  TempSrc: Oral  Height: 5\' 5"  (1.651 m)  Weight: 63.504 kg (140 lb)  SpO2: 98%     General: Awake alert oriented 3, tearful, no acute distress HEENT: Normocephalic and Atraumatic, Mucous membranes pink                PERRLA; EOM intact; No scleral icterus,                  Nares: Patent, Oropharynx: Clear, Fair Dentition                 Neck: FROM, no cervical lymphadenopathy, thyromegaly, carotid bruit or JVD;  Breasts: deferred CHEST WALL: No tenderness  CHEST: Normal respiration, clear to auscultation bilaterally  HEART: Regular rate and rhythm; no murmurs rubs or gallops  BACK: No kyphosis or scoliosis; no CVA tenderness  GI: Positive Bowel Sounds, soft, non-tender; no masses, no organomegaly Rectal Exam: deferred MSK: No cyanosis, clubbing, or edema-  Erythema extending from left knee to left ankle. Ulcerated area below the left knee with packing in place. Skin around this area appears necrotic. Genitalia: not examined  SKIN:  no rash or ulceration  CNS: Alert and Oriented x 4, Nonfocal exam, CN 2-12 intact  Labs on Admission:  Basic Metabolic Panel:  Recent Labs Lab 09/22/14 1330 09/23/14 1719  NA 140 136  K 4.0 4.5  CL 103 100*  CO2 28 29  GLUCOSE 91 103*  BUN 7 5*  CREATININE 0.70 0.73  CALCIUM 9.4 9.2   Liver Function Tests:  Recent Labs Lab 09/22/14 1330 09/23/14 1719  AST 22 25  ALT 15 21  ALKPHOS 51 54  BILITOT 0.7 0.8  PROT 8.2* 8.0  ALBUMIN 4.6 4.1   No results for input(s): LIPASE, AMYLASE in the last 168 hours. No results for input(s): AMMONIA in the last 168 hours. CBC:  Recent Labs Lab 09/22/14 1330 09/23/14 1719  WBC 12.9* 9.8  NEUTROABS 9.9* 8.1*  HGB 14.6 14.0  HCT 42.9 40.8  MCV 91.1 92.3  PLT 177 139*   Cardiac Enzymes: No results for input(s): CKTOTAL, CKMB, CKMBINDEX, TROPONINI in the last 168 hours.  BNP (last 3 results) No results for input(s): BNP in the last 8760 hours.  ProBNP (last 3 results) No results for input(s): PROBNP in the last 8760 hours.  CBG: No results for input(s): GLUCAP in the last 168 hours.  Radiological Exams on Admission: Dg Tibia/fibula Right  09/22/2014   CLINICAL DATA:  Continued pain and swelling about the right lower leg just below the knee since a  insect bite 2 weeks ago. Initial encounter.  EXAM: RIGHT TIBIA AND FIBULA - 2 VIEW  COMPARISON:  None.  FINDINGS: No radiopaque foreign body or soft tissue gas collection is identified. Soft tissues are unremarkable. All imaged bones and joints appear normal.  IMPRESSION: Negative exam.   Electronically Signed   By: Drusilla Kanner M.D.   On: 09/22/2014 13:57   Ct Tibia Fibula Right W Contrast  09/22/2014   CLINICAL DATA:  Insect bite on the upper leg 1 month ago. Worsening symptoms following antibiotic therapy. Pain extending down the leg.  EXAM: CT OF THE LOWER RIGHT EXTREMITY WITH CONTRAST  TECHNIQUE: Multidetector CT imaging of  the RIGHT leg was performed according to the standard protocol following intravenous contrast administration.  COMPARISON:  09/22/2014.  CONTRAST:  OMNIPAQUE IOHEXOL 300 MG/ML  SOLN  FINDINGS: A cutaneous abscess is present extending into the subcutaneous tissues of the proximal RIGHT leg. This is medial and anterior to the proximal tibial metaphysis and is probably palpable. This is in the area identified with cutaneous marker. The abscess measures 13 mm AP, 29 mm transverse and 42 mm craniocaudal. This demonstrates peripheral enhancement with central low attenuation, typical of abscess. No gas is present. Surrounding cellulitis is present. Neurovascular bundles are within normal limits. Negative for osteomyelitis. Flexor and extensor tendons at the ankle appear normal. Marrow signal appears normal. Visible portions of the RIGHT knee are within normal limits.  IMPRESSION: Superficial proximal RIGHT leg medial cutaneous and subcutaneous abscess.   Electronically Signed   By: Andreas Newport M.D.   On: 09/22/2014 16:30     Assessment/Plan Principal Problem:   Cellulitis and abscess of leg, leukocytosis, fevers-sepsis -Rapid spread of cellulitis in the past 48 hours from a small circumscribed area below the knee to involving the entire lower leg -Failed 2 courses of  doxycycline and one course of doxycycline plus rifampin -We'll remove packing to see and see we can obtain more Pus sent for culture (patient not allowing me to remove it until she has received IV pain medication)-  wound was not cultured yesterday when it was drained initially - Start vancomycin- she is not aware of having any history of MRSA -WBC count has improved from 12-9 between yesterday and today -Tylenol for fevers -Dilaudid & oxycodone for pain   Active Problems:    Thrombocytopenia -Likely due to acute infection -follow  Anorexia -Also likely due to above-poor intake of solid food-she is declining supplements at this time and states that she has been able to maintain her hydration  Migraines -When necessary Relpax ordered    Consulted:   Code Status: full code  Family Communication:  Husband at bedside DVT Prophylaxis: Lovenox  Time spent: 55 min  Arvella Massingale, MD [facility]  If 7PM-7AM, please contact night-coverage [email or web address] 09/23/2014, 6:23 PM

## 2014-09-23 NOTE — Progress Notes (Signed)
ANTIBIOTIC CONSULT NOTE - INITIAL  Pharmacy Consult for Vancomycin Indication: Cellulitis, r/o fasciitis  Allergies  Allergen Reactions  . Vicodin [Hydrocodone-Acetaminophen] Other (See Comments)    "It makes me paranoid and angry"    Patient Measurements: Height:  (165.1 cm) Weight: 140 lb (63.504 kg) IBW/kg (Calculated) : 57  Vital Signs: Temp: 98.5 F (36.9 C) (09/07 2007) Temp Source: Axillary (09/07 2007) BP: 124/74 mmHg (09/07 2007) Pulse Rate: 111 (09/07 2007) Intake/Output from previous day:   Intake/Output from this shift:    Labs:  Recent Labs  09/22/14 1330 09/23/14 1719  WBC 12.9* 9.8  HGB 14.6 14.0  PLT 177 139*  CREATININE 0.70 0.72  0.73   Estimated Creatinine Clearance: 93.4 mL/min (by C-G formula based on Cr of 0.73). No results for input(s): VANCOTROUGH, VANCOPEAK, VANCORANDOM, GENTTROUGH, GENTPEAK, GENTRANDOM, TOBRATROUGH, TOBRAPEAK, TOBRARND, AMIKACINPEAK, AMIKACINTROU, AMIKACIN in the last 72 hours.   Microbiology: No results found for this or any previous visit (from the past 720 hour(s)).  Medical History: History reviewed. No pertinent past medical history.  Medications:  Anti-infectives    Start     Dose/Rate Route Frequency Ordered Stop   09/24/14 0400  piperacillin-tazobactam (ZOSYN) IVPB 3.375 g     3.375 g 12.5 mL/hr over 240 Minutes Intravenous Every 8 hours 09/23/14 2105     09/23/14 1945  piperacillin-tazobactam (ZOSYN) IVPB 3.375 g     3.375 g 100 mL/hr over 30 Minutes Intravenous  Once 09/23/14 1937     09/23/14 1830  vancomycin (VANCOCIN) IVPB 1000 mg/200 mL premix     1,000 mg 200 mL/hr over 60 Minutes Intravenous  Once 09/23/14 1829 09/23/14 1946     Assessment: 29yo F with worsening R lower leg cellulitis possibly from a bug bite which hasn't improved on outpatient antibiotics. A CT on 09/22/14 indicated a cutaneous abscess which was drained. Vanc and Zosyn are given in the ED. Surgery recommended continuing these  so pharmacy obtained orders from [facility] to continue these.  Goal of Therapy:  Vancomycin trough level 15-20 mcg/ml  Appropriate antibiotic dosing for renal function; eradication of infection  Plan:  Vanc  IV q8h. Zosyn 3.375g IV Q8H infused over 4hrs. Measure Vanc trough at steady state. Follow up renal fxn, culture results, and clinical course.  Charolotte Eke, PharmD, pager [phone]. 09/23/2014,9:15 PM.

## 2014-09-23 NOTE — ED Notes (Signed)
Attempted to call report, RN unavailable at this time. Awaiting return call 

## 2014-09-23 NOTE — Consult Note (Signed)
Reason for Consult:cellulitis, r/o fasciitis Referring Physician: Dr Anola Gurney is an 29 y.o. female.  HPI: 69 yof presents after having a bugbite or some lesion on her right lower leg.  She attempted antibiotics times two that helped cellulitis. Eventually this led to worsening pain and a "bubble" she was seen at [facility] last night and underwent evaluation that showed elevated wbc and ct with cutaneous abscess. This was drained by the er staff last night and got some purulence but not a lot. This was necrotic apparently.  She then worsened overnight on clindamycin with worse pain especially medial to this and cellulitis increased.  She presented to er again. I was asked to see er to rule out fasciitis.  History reviewed. No pertinent past medical history.  Past Surgical History  Procedure Laterality Date  . Knee surgery    . Mandible fracture surgery    . Nose surgery      No family history on file.  Social History:  reports that she has been smoking.  She does not have any smokeless tobacco history on file. She reports that she does not drink alcohol or use illicit drugs.  Allergies:  Allergies  Allergen Reactions  . Vicodin [Hydrocodone-Acetaminophen] Other (See Comments)    "It makes me paranoid and angry"    Medications:reviewed  Results for orders placed or performed during the hospital encounter of 09/23/14 (from the past 48 hour(s))  CBC with Differential     Status: Abnormal   Collection Time: 09/23/14  5:19 PM  Result Value Ref Range   WBC 9.8 4.0 - 10.5 K/uL   RBC 4.42 3.87 - 5.11 MIL/uL   Hemoglobin 14.0 12.0 - 15.0 g/dL   HCT 40.8 36.0 - 46.0 %   MCV 92.3 78.0 - 100.0 fL   MCH 31.7 26.0 - 34.0 pg   MCHC 34.3 30.0 - 36.0 g/dL   RDW 12.0 11.5 - 15.5 %   Platelets 139 (L) 150 - 400 K/uL   Neutrophils Relative % 83 (H) 43 - 77 %   Neutro Abs 8.1 (H) 1.7 - 7.7 K/uL   Lymphocytes Relative 10 (L) 12 - 46 %   Lymphs Abs 1.0 0.7 - 4.0  K/uL   Monocytes Relative 7 3 - 12 %   Monocytes Absolute 0.7 0.1 - 1.0 K/uL   Eosinophils Relative 0 0 - 5 %   Eosinophils Absolute 0.0 0.0 - 0.7 K/uL   Basophils Relative 0 0 - 1 %   Basophils Absolute 0.0 0.0 - 0.1 K/uL  Comprehensive metabolic panel     Status: Abnormal   Collection Time: 09/23/14  5:19 PM  Result Value Ref Range   Sodium 136 135 - 145 mmol/L   Potassium 4.5 3.5 - 5.1 mmol/L   Chloride 100 (L) 101 - 111 mmol/L   CO2 29 22 - 32 mmol/L   Glucose, Bld 103 (H) 65 - 99 mg/dL   BUN 5 (L) 6 - 20 mg/dL   Creatinine, Ser 0.73 0.44 - 1.00 mg/dL   Calcium 9.2 8.9 - 10.3 mg/dL   Total Protein 8.0 6.5 - 8.1 g/dL   Albumin 4.1 3.5 - 5.0 g/dL   AST 25 15 - 41 U/L   ALT 21 14 - 54 U/L   Alkaline Phosphatase 54 38 - 126 U/L   Total Bilirubin 0.8 0.3 - 1.2 mg/dL   GFR calc non Af Amer >60 >60 mL/min   GFR calc Af Amer >60 >  60 mL/min    Comment: (NOTE) The eGFR has been calculated using the CKD EPI equation. This calculation has not been validated in all clinical situations. eGFR's persistently <60 mL/min signify possible Chronic Kidney Disease.    Anion gap 7 5 - 15  Sedimentation rate     Status: Abnormal   Collection Time: 09/23/14  5:19 PM  Result Value Ref Range   Sed Rate 26 (H) 0 - 22 mm/hr    Dg Tibia/fibula Right  09/22/2014   CLINICAL DATA:  Continued pain and swelling about the right lower leg just below the knee since a insect bite 2 weeks ago. Initial encounter.  EXAM: RIGHT TIBIA AND FIBULA - 2 VIEW  COMPARISON:  None.  FINDINGS: No radiopaque foreign body or soft tissue gas collection is identified. Soft tissues are unremarkable. All imaged bones and joints appear normal.  IMPRESSION: Negative exam.   Electronically Signed   By: Inge Rise M.D.   On: 09/22/2014 13:57   Ct Tibia Fibula Right W Contrast  09/22/2014   CLINICAL DATA:  Insect bite on the upper leg 1 month ago. Worsening symptoms following antibiotic therapy. Pain extending down the leg.   EXAM: CT OF THE LOWER RIGHT EXTREMITY WITH CONTRAST  TECHNIQUE: Multidetector CT imaging of the RIGHT leg was performed according to the standard protocol following intravenous contrast administration.  COMPARISON:  09/22/2014.  CONTRAST:  123m OMNIPAQUE IOHEXOL 300 MG/ML  SOLN  FINDINGS: A cutaneous abscess is present extending into the subcutaneous tissues of the proximal RIGHT leg. This is medial and anterior to the proximal tibial metaphysis and is probably palpable. This is in the area identified with cutaneous marker. The abscess measures 13 mm AP, 29 mm transverse and 42 mm craniocaudal. This demonstrates peripheral enhancement with central low attenuation, typical of abscess. No gas is present. Surrounding cellulitis is present. Neurovascular bundles are within normal limits. Negative for osteomyelitis. Flexor and extensor tendons at the ankle appear normal. Marrow signal appears normal. Visible portions of the RIGHT knee are within normal limits.  IMPRESSION: Superficial proximal RIGHT leg medial cutaneous and subcutaneous abscess.   Electronically Signed   By: GDereck LigasM.D.   On: 09/22/2014 16:30    Review of Systems  Constitutional: Negative for fever.  Respiratory: Negative for shortness of breath.   Cardiovascular: Negative for chest pain.  Gastrointestinal: Negative for nausea and vomiting.   Blood pressure 110/71, pulse 91, temperature 98.7 F (37.1 C), temperature source Oral, resp. rate 18, height '5\' 5"'  (1.651 m), weight 63.504 kg (140 lb), SpO2 94 %. Physical Exam  Vitals reviewed. Constitutional: She appears well-developed and well-nourished.  Musculoskeletal:       Legs: She has cellulitis over anterior and medial right lower leg.  There is a 2x3 cm open area with some necrosis of skin edges. I probed this with my finger and cannot identify any other pockets. There is no drainage.  Her right le is neurovascularly intact. She has no pain on passive stretch. She also has  some mild right inguinal lad    Assessment/Plan: Right lower extremity cellulitis  After examining and reviewing all data I dont think she has fasciitis on exam.  I dont think she has a drainable area anymore either.  I think given her clinical picture it would be reasonable to attempt iv abx overnight to see if gets better. If worsens then will consider surgery. I would give vanc but broaden coverage to include zosyn.  Im not sure  what culture that was done tonight will mean.  We will follow with you  [facility] 09/23/2014, 7:48 PM

## 2014-09-24 LAB — BASIC METABOLIC PANEL
Anion gap: 8 (ref 5–15)
BUN: 5 mg/dL — ABNORMAL LOW (ref 6–20)
CHLORIDE: 106 mmol/L (ref 101–111)
CO2: 25 mmol/L (ref 22–32)
CREATININE: 0.73 mg/dL (ref 0.44–1.00)
Calcium: 9.1 mg/dL (ref 8.9–10.3)
GFR calc non Af Amer: >60 mL/min (ref >60)
GLUCOSE: 165 mg/dL — AB (ref 65–99)
Potassium: 4.4 mmol/L (ref 3.5–5.1)
Sodium: 139 mmol/L (ref 135–145)

## 2014-09-24 LAB — CBC
HCT: 38 % (ref 36.0–46.0)
Hemoglobin: 12.9 g/dL (ref 12.0–15.0)
MCH: 31.5 pg (ref 26.0–34.0)
MCHC: 33.9 g/dL (ref 30.0–36.0)
MCV: 92.7 fL (ref 78.0–100.0)
PLATELETS: 140 10*3/uL — AB (ref 150–400)
RBC: 4.1 MIL/uL (ref 3.87–5.11)
RDW: 12.1 % (ref 11.5–15.5)
WBC: 7.7 10*3/uL (ref 4.0–10.5)

## 2014-09-24 MED ORDER — DIPHENHYDRAMINE HCL 50 MG/ML IJ SOLN
25.0000 mg | Freq: Three times a day (TID) | INTRAMUSCULAR | Status: DC
  Administered 2014-09-24 – 2014-09-26 (×6): 25 mg via INTRAVENOUS
  Filled 2014-09-24 (×3): qty 0.5
  Filled 2014-09-24: qty 1
  Filled 2014-09-24: qty 0.5
  Filled 2014-09-24 (×3): qty 1
  Filled 2014-09-24: qty 0.5
  Filled 2014-09-24: qty 1
  Filled 2014-09-24: qty 0.5

## 2014-09-24 NOTE — Progress Notes (Signed)
Nutrition Brief Note  Patient identified on the Malnutrition Screening Tool (MST) Report  Wt Readings from Last 15 Encounters:  09/23/14 140 lb (63.504 kg)  09/22/14 140 lb (63.504 kg)    Body mass index is 23.3 kg/(m^2). Patient meets criteria for normal weight based on current BMI.   Pt seen for MST. Pt reports that before breakfast this AM she had not eaten since Monday (9/5) AM. She attributes this to severe nausea without emesis. She states that during that time she would eat crackers in order to take medications but had no other food intakes.   She states that today nausea has been subsiding and was not exacerbated by PO intakes.  Current diet order is Regular, patient is consuming approximately 100% of meals at this time. Labs and medications reviewed.   No nutrition interventions warranted at this time. If nutrition issues arise, please consult RD.       Trenton Gammon, RD, LDN Inpatient Clinical Dietitian Pager # [phone] After hours/weekend pager # [phone]

## 2014-09-24 NOTE — Progress Notes (Signed)
Pt admitted from the ED via stretcher.  Once pt settled in bed, pt c/o severe itching, especially in back.  Redness noted in chest, back and face.  Pt very anxious and c/o tightness in chest.  Rapid response called.  Vitals taken; slightly tachycardic.  K Schorr notified.  Orders received. EKG done

## 2014-09-24 NOTE — Progress Notes (Signed)
Subjective: Feeling better, but leg still tender. CT scan reviewed. It appears that the wound has been adequately unroofed and debrided  Afebrile.  Heart rate 71. WBC 7700.  Glucose 165   Objective: Vital signs in last 24 hours: Temp:  [98.3 F (36.8 C)-98.7 F (37.1 C)] 98.3 F (36.8 C) (09/08 0603) Pulse Rate:  [71-111] 71 (09/08 0603) Resp:  [16-18] 18 (09/08 0603) BP: (102-124)/(57-78) 102/57 mmHg (09/08 0603) SpO2:  [94 %-99 %] 98 % (09/08 0603) Weight:  [63.504 kg (140 lb)] 63.504 kg (140 lb) (09/07 1619) Last BM Date: 09/23/14  Intake/Output from previous day: 09/07 0701 - 09/08 0700 In: 1500 [I.V.:1200; IV Piggyback:300] Out: 2 [Urine:2] Intake/Output this shift:    General appearance: Alert.  Cooperative.  Upbeat attitude.  In no distress except when examining wound. Extremities--- she has cellulitis over the anterior and medial right lower leg below the knee.  Minimal swelling but tender.  There is a 2 x 3 sonometer open area with some necrosis of the skin at the edges but no purulence or odor.  The muscle compartments appear soft.  Neurovascular intact.  Minimal induration.  Lab Results:   Recent Labs  09/23/14 1719 09/24/14 0535  WBC 9.8 7.7  HGB 14.0 12.9  HCT 40.8 38.0  PLT 139* 140*   BMET  Recent Labs  09/23/14 1719 09/24/14 0535  NA 136 139  K 4.5 4.4  CL 100* 106  CO2 29 25  GLUCOSE 103* 165*  BUN 5* 5*  CREATININE 0.72  0.73 0.73  CALCIUM 9.2 9.1   PT/INR No results for input(s): LABPROT, INR in the last 72 hours. ABG No results for input(s): PHART, HCO3 in the last 72 hours.  Invalid input(s): PCO2, PO2  Studies/Results: Dg Tibia/fibula Right  09/22/2014   CLINICAL DATA:  Continued pain and swelling about the right lower leg just below the knee since a insect bite 2 weeks ago. Initial encounter.  EXAM: RIGHT TIBIA AND FIBULA - 2 VIEW  COMPARISON:  None.  FINDINGS: No radiopaque foreign body or soft tissue gas collection is  identified. Soft tissues are unremarkable. All imaged bones and joints appear normal.  IMPRESSION: Negative exam.   Electronically Signed   By: Drusilla Kanner M.D.   On: 09/22/2014 13:57   Ct Tibia Fibula Right W Contrast  09/22/2014   CLINICAL DATA:  Insect bite on the upper leg 1 month ago. Worsening symptoms following antibiotic therapy. Pain extending down the leg.  EXAM: CT OF THE LOWER RIGHT EXTREMITY WITH CONTRAST  TECHNIQUE: Multidetector CT imaging of the RIGHT leg was performed according to the standard protocol following intravenous contrast administration.  COMPARISON:  09/22/2014.  CONTRAST:  OMNIPAQUE IOHEXOL 300 MG/ML  SOLN  FINDINGS: A cutaneous abscess is present extending into the subcutaneous tissues of the proximal RIGHT leg. This is medial and anterior to the proximal tibial metaphysis and is probably palpable. This is in the area identified with cutaneous marker. The abscess measures 13 mm AP, 29 mm transverse and 42 mm craniocaudal. This demonstrates peripheral enhancement with central low attenuation, typical of abscess. No gas is present. Surrounding cellulitis is present. Neurovascular bundles are within normal limits. Negative for osteomyelitis. Flexor and extensor tendons at the ankle appear normal. Marrow signal appears normal. Visible portions of the RIGHT knee are within normal limits.  IMPRESSION: Superficial proximal RIGHT leg medial cutaneous and subcutaneous abscess.   Electronically Signed   By: Andreas Newport M.D.   On: 09/22/2014  16:30    Anti-infectives: Anti-infectives    Start     Dose/Rate Route Frequency Ordered Stop   09/24/14 0400  piperacillin-tazobactam (ZOSYN) IVPB 3.375 g     3.375 g 12.5 mL/hr over 240 Minutes Intravenous Every 8 hours 09/23/14 2105     09/24/14 0400  vancomycin (VANCOCIN) IVPB 750 mg/150 ml premix     750 mg 75 mL/hr over 120 Minutes Intravenous Every 8 hours 09/23/14 2108     09/23/14 1945  piperacillin-tazobactam (ZOSYN)  IVPB 3.375 g     3.375 g 100 mL/hr over 30 Minutes Intravenous  Once 09/23/14 1937 09/23/14 2311   09/23/14 1830  vancomycin (VANCOCIN) IVPB 1000 mg/200 mL premix     1,000 mg 200 mL/hr over 60 Minutes Intravenous  Once 09/23/14 1829 09/23/14 1946      Assessment/Plan:  Soft tissue infection right lower extremity.  One month duration.  Progressive despite outpatient antibiotic therapy. Suspect gram-positive organism Agree with vancomycin and Zosyn Seems under control at this time and no obvious indication for surgery Wound care ordered Elevation ordered  Advise infectious disease consult   LOS: 1 day    Margaret Woods M 09/24/2014

## 2014-09-24 NOTE — Progress Notes (Signed)
[facility] Progress Note   Margaret Woods  AVW:[id number]  DOB: 1985-05-10  DOA: 09/23/2014 PCP: Gweneth Dimitri, MD  Brief narrative: Margaret Woods is a 29 y.o. female with h/o migraines who developed swelling and redness on her right leg about 3 wks ago. She was given 2 courses of Doxycycline 10 days each and a third course of Doxycycline and Rifampin for 7 days. The swelling and redness did not improve. About 2 days prior to admission. The following day the area was lanced at [facility] and she was placed on clindamycin. By the next day the redness had extended from a small localized area on the right leg is involved the entire anterior part of the right face and she began to develop fevers and chills.   Subjective: Improvement in redness. Continues to have significant pain in the leg.  Assessment/Plan: Principal Problem:   Cellulitis and abscess of leg -Rapid spread of cellulitis in 48 hours from a small circumscribed area below the knee to involving the entire lower leg- this occurred while on Clindamycin  -Failed 2 courses of doxycycline and one course of doxycycline plus rifampin - wound was not cultured  when it was drained initially- cultures sent on 9/7 obtained from swabbing the abscess cavity  - Started vancomycin- Surgery added Zosyn  -WBC count has improved  -  fevers resolved -Dilaudid & oxycodone for pain  Active Problems:   Thrombocytopenia -Likely due to acute infection -follow  Anorexia -Also likely due to above- resolving   Migraines -When necessary Relpax ordered    Code Status:     Code Status Orders        Start     Ordered   09/23/14 1837  Full code   Continuous     09/23/14 1839     Family Communication: husband Disposition Plan: home in 1-2 days DVT prophylaxis: Lovenox Consultants:Gen surgery Procedures: Appt with PCP: requested Antibiotics: Anti-infectives    Start     Dose/Rate Route Frequency  Ordered Stop   09/24/14 0400  piperacillin-tazobactam (ZOSYN) IVPB 3.375 g     3.375 g 12.5 mL/hr over 240 Minutes Intravenous Every 8 hours 09/23/14 2105     09/24/14 0400  vancomycin (VANCOCIN) IVPB 750 mg/150 ml premix     750 mg 75 mL/hr over 120 Minutes Intravenous Every 8 hours 09/23/14 2108     09/23/14 1945  piperacillin-tazobactam (ZOSYN) IVPB 3.375 g     3.375 g 100 mL/hr over 30 Minutes Intravenous  Once 09/23/14 1937 09/23/14 2311   09/23/14 1830  vancomycin (VANCOCIN) IVPB 1000 mg/200 mL premix     1,000 mg 200 mL/hr over 60 Minutes Intravenous  Once 09/23/14 1829 09/23/14 1946      Objective: Filed Weights   09/23/14 1619  Weight: 63.504 kg (140 lb)    Intake/Output Summary (Last 24 hours) at 09/24/14 1557 Last data filed at 09/24/14 0617  Gross per 24 hour  Intake   1500 ml  Output      2 ml  Net   1498 ml     Vitals Filed Vitals:   09/23/14 1901 09/23/14 2007 09/24/14 0603 09/24/14 1400  BP: 110/71 124/74 102/57 103/62  Pulse: 91 111 71 65  Temp:  98.5 F (36.9 C) 98.3 F (36.8 C) 98.3 F (36.8 C)  TempSrc:  Axillary Oral Oral  Resp: 18 16 18    Height:      Weight:      SpO2: 94% 99% 98%  98%    Exam:  General:  Pt is alert, not in acute distress  HEENT: No icterus, No thrush, oral mucosa moist  Cardiovascular: regular rate and rhythm, S1/S2 No murmur  Respiratory: clear to auscultation bilaterally   Abdomen: Soft, +Bowel sounds, non tender, non distended, no guarding  MSK: No cyanosis or clubbing- right leg erythema is improving-still has necrotic skin around abscess cavity-small amount of drainage of blood on the bandage  Data Reviewed: Basic Metabolic Panel:  Recent Labs Lab 09/22/14 1330 09/23/14 1719 09/24/14 0535  NA 140 136 139  K 4.0 4.5 4.4  CL 103 100* 106  CO2 28 29 25   GLUCOSE 91 103* 165*  BUN 7 5* 5*  CREATININE 0.70 0.72  0.73 0.73  CALCIUM 9.4 9.2 9.1   Liver Function Tests:  Recent Labs Lab 09/22/14 1330  09/23/14 1719  AST 22 25  ALT 15 21  ALKPHOS 51 54  BILITOT 0.7 0.8  PROT 8.2* 8.0  ALBUMIN 4.6 4.1   No results for input(s): LIPASE, AMYLASE in the last 168 hours. No results for input(s): AMMONIA in the last 168 hours. CBC:  Recent Labs Lab 09/22/14 1330 09/23/14 1719 09/24/14 0535  WBC 12.9* 9.8 7.7  NEUTROABS 9.9* 8.1*  --   HGB 14.6 14.0 12.9  HCT 42.9 40.8 38.0  MCV 91.1 92.3 92.7  PLT 177 139* 140*   Cardiac Enzymes: No results for input(s): CKTOTAL, CKMB, CKMBINDEX, TROPONINI in the last 168 hours. BNP (last 3 results) No results for input(s): BNP in the last 8760 hours.  ProBNP (last 3 results) No results for input(s): PROBNP in the last 8760 hours.  CBG: No results for input(s): GLUCAP in the last 168 hours.  Recent Results (from the past 240 hour(s))  Wound culture     Status: None (Preliminary result)   Collection Time: 09/23/14  6:53 PM  Result Value Ref Range Status   Specimen Description LEG RIGHT  Final   Special Requests SWAB  Final   Gram Stain   Final    ABUNDANT WBC PRESENT, PREDOMINANTLY PMN NO SQUAMOUS EPITHELIAL CELLS SEEN MODERATE GRAM POSITIVE COCCI IN PAIRS Performed at [facility]    Culture PENDING  Incomplete   Report Status PENDING  Incomplete  Wound culture     Status: None (Preliminary result)   Collection Time: 09/23/14  7:30 PM  Result Value Ref Range Status   Specimen Description WOUND R LEG DEEP  Final   Special Requests NONE  Final   Gram Stain   Final    RARE WBC PRESENT, PREDOMINANTLY PMN NO SQUAMOUS EPITHELIAL CELLS SEEN NO ORGANISMS SEEN Performed at [facility]    Culture PENDING  Incomplete   Report Status PENDING  Incomplete     Studies: Ct Tibia Fibula Right W Contrast  09/22/2014   CLINICAL DATA:  Insect bite on the upper leg 1 month ago. Worsening symptoms following antibiotic therapy. Pain extending down the leg.  EXAM: CT OF THE LOWER RIGHT EXTREMITY WITH CONTRAST  TECHNIQUE:  Multidetector CT imaging of the RIGHT leg was performed according to the standard protocol following intravenous contrast administration.  COMPARISON:  09/22/2014.  CONTRAST:  OMNIPAQUE IOHEXOL 300 MG/ML  SOLN  FINDINGS: A cutaneous abscess is present extending into the subcutaneous tissues of the proximal RIGHT leg. This is medial and anterior to the proximal tibial metaphysis and is probably palpable. This is in the area identified with cutaneous marker. The abscess measures 13 mm  AP, 29 mm transverse and 42 mm craniocaudal. This demonstrates peripheral enhancement with central low attenuation, typical of abscess. No gas is present. Surrounding cellulitis is present. Neurovascular bundles are within normal limits. Negative for osteomyelitis. Flexor and extensor tendons at the ankle appear normal. Marrow signal appears normal. Visible portions of the RIGHT knee are within normal limits.  IMPRESSION: Superficial proximal RIGHT leg medial cutaneous and subcutaneous abscess.   Electronically Signed   By: Andreas Newport M.D.   On: 09/22/2014 16:30    Scheduled Meds:  Scheduled Meds: . diphenhydrAMINE  25 mg Intravenous Q8H  . enoxaparin (LOVENOX) injection  40 mg Subcutaneous Q24H  . piperacillin-tazobactam (ZOSYN)  IV  3.375 g Intravenous Q8H  . vancomycin  750 mg Intravenous Q8H   Continuous Infusions:   Time spent on care of this patient: 30 min   Jaedin Trumbo, MD 09/24/2014, 3:57 PM  LOS: 1 day   [facility] Office  [phone] Pager - Text Page per [email or web address] If 7PM-7AM, please contact night-coverage [email or web address]

## 2014-09-24 NOTE — Progress Notes (Signed)
Benadryl given as ordered.  Pt states she is feeling much better; itching is subsiding as well as the redness.  ICU nurse in room (rapid response) monitoring pt. Pt states her chest has minimal pressure but is feeling much better than before.  Will continue to monitor pt closely

## 2014-09-25 DIAGNOSIS — B9561 Methicillin susceptible Staphylococcus aureus infection as the cause of diseases classified elsewhere: Secondary | ICD-10-CM

## 2014-09-25 DIAGNOSIS — L02415 Cutaneous abscess of right lower limb: Secondary | ICD-10-CM

## 2014-09-25 LAB — CBC WITH DIFFERENTIAL/PLATELET
Basophils Absolute: 0 10*3/uL (ref 0.0–0.1)
Basophils Relative: 0 % (ref 0–1)
EOS ABS: 0 10*3/uL (ref 0.0–0.7)
Eosinophils Relative: 0 % (ref 0–5)
HEMATOCRIT: 34 % — AB (ref 36.0–46.0)
HEMOGLOBIN: 11.6 g/dL — AB (ref 12.0–15.0)
LYMPHS ABS: 1.1 10*3/uL (ref 0.7–4.0)
LYMPHS PCT: 10 % — AB (ref 12–46)
MCH: 31.4 pg (ref 26.0–34.0)
MCHC: 34.1 g/dL (ref 30.0–36.0)
MCV: 91.9 fL (ref 78.0–100.0)
MONOS PCT: 11 % (ref 3–12)
Monocytes Absolute: 1.2 10*3/uL — ABNORMAL HIGH (ref 0.1–1.0)
NEUTROS PCT: 79 % — AB (ref 43–77)
Neutro Abs: 8.4 10*3/uL — ABNORMAL HIGH (ref 1.7–7.7)
Platelets: 137 10*3/uL — ABNORMAL LOW (ref 150–400)
RBC: 3.7 MIL/uL — ABNORMAL LOW (ref 3.87–5.11)
RDW: 12.1 % (ref 11.5–15.5)
WBC: 10.8 10*3/uL — ABNORMAL HIGH (ref 4.0–10.5)

## 2014-09-25 LAB — VANCOMYCIN, TROUGH: VANCOMYCIN TR: 13 ug/mL (ref 10.0–20.0)

## 2014-09-25 MED ORDER — POLYETHYLENE GLYCOL 3350 17 G PO PACK
17.0000 g | PACK | Freq: Every day | ORAL | Status: DC | PRN
  Administered 2014-09-25: 17 g via ORAL
  Filled 2014-09-25: qty 1

## 2014-09-25 MED ORDER — DOCUSATE SODIUM 100 MG PO CAPS
100.0000 mg | ORAL_CAPSULE | Freq: Two times a day (BID) | ORAL | Status: DC
  Administered 2014-09-25 (×2): 100 mg via ORAL
  Filled 2014-09-25 (×4): qty 1

## 2014-09-25 NOTE — Progress Notes (Signed)
ANTIBIOTIC CONSULT NOTE  Pharmacy Consult for Vancomycin Indication: Cellulitis  Allergies  Allergen Reactions  . Vicodin [Hydrocodone-Acetaminophen] Other (See Comments)    "It makes me paranoid and angry"    Patient Measurements: Height:  (165.1 cm) Weight: 140 lb (63.504 kg) IBW/kg (Calculated) : 57  Vital Signs: Temp: 98.2 F (36.8 C) (09/09 1023) Temp Source: Oral (09/09 1023) BP: 111/64 mmHg (09/09 1023) Pulse Rate: 62 (09/09 1023) Intake/Output from previous day: 09/08 0701 - 09/09 0700 In: 240 [P.O.:240] Out: -  Intake/Output from this shift:    Labs:  Recent Labs  09/22/14 1330 09/23/14 1719 09/24/14 0535 09/25/14 0520  WBC 12.9* 9.8 7.7 10.8*  HGB 14.6 14.0 12.9 11.6*  PLT 177 139* 140* 137*  CREATININE 0.70 0.72  0.73 0.73  --    Estimated Creatinine Clearance: 93.4 mL/min (by C-G formula based on Cr of 0.73).  Recent Labs  09/25/14 1140  VANCOTROUGH 13     Microbiology: Recent Results (from the past 720 hour(s))  Wound culture     Status: None (Preliminary result)   Collection Time: 09/23/14  6:53 PM  Result Value Ref Range Status   Specimen Description LEG RIGHT  Final   Special Requests SWAB  Final   Gram Stain   Final    ABUNDANT WBC PRESENT, PREDOMINANTLY PMN NO SQUAMOUS EPITHELIAL CELLS SEEN MODERATE GRAM POSITIVE COCCI IN PAIRS Performed at [facility]    Culture   Final    MODERATE STAPHYLOCOCCUS AUREUS Note: RIFAMPIN AND GENTAMICIN SHOULD NOT BE USED AS SINGLE DRUGS FOR TREATMENT OF STAPH INFECTIONS. Performed at [facility]    Report Status PENDING  Incomplete  Wound culture     Status: None (Preliminary result)   Collection Time: 09/23/14  7:30 PM  Result Value Ref Range Status   Specimen Description WOUND R LEG DEEP  Final   Special Requests NONE  Final   Gram Stain   Final    RARE WBC PRESENT, PREDOMINANTLY PMN NO SQUAMOUS EPITHELIAL CELLS SEEN NO ORGANISMS SEEN Performed at [facility]    Culture   Final    MODERATE STAPHYLOCOCCUS AUREUS Note: RIFAMPIN AND GENTAMICIN SHOULD NOT BE USED AS SINGLE DRUGS FOR TREATMENT OF STAPH INFECTIONS. Performed at [facility]    Report Status PENDING  Incomplete    Medical History: History reviewed. No pertinent past medical history.  Medications:  Anti-infectives    Start     Dose/Rate Route Frequency Ordered Stop   09/24/14 0400  piperacillin-tazobactam (ZOSYN) IVPB 3.375 g  Status:  Discontinued     3.375 g 12.5 mL/hr over 240 Minutes Intravenous Every 8 hours 09/23/14 2105 09/25/14 1057   09/24/14 0400  vancomycin (VANCOCIN) IVPB 750 mg/150 ml premix     750 mg 75 mL/hr over 120 Minutes Intravenous Every 8 hours 09/23/14 2108     09/23/14 1945  piperacillin-tazobactam (ZOSYN) IVPB 3.375 g     3.375 g 100 mL/hr over 30 Minutes Intravenous  Once 09/23/14 1937 09/23/14 2311   09/23/14 1830  vancomycin (VANCOCIN) IVPB 1000 mg/200 mL premix     1,000 mg 200 mL/hr over 60 Minutes Intravenous  Once 09/23/14 1829 09/23/14 1946     Assessment: 29yo F with worsening R lower leg cellulitis possibly from a bug bite which hasn't improved on outpatient antibiotics. A CT on 09/22/14 indicated a cutaneous abscess which was drained. Cx data + SA (sens pending). Patient is afebrile.  Increase in WBC noted  today. Renal function is stable.  Vancomycin trough at goal for cellulitis (VT=10-15) and will likely continue to accumulate so will not increase dose at this time.  ID consult pending.  9/7>> Vanc >> 9/7>> Zosyn  >>   9/6>>Clinda >>9/7 *Doxy x 10 days for 2 courses then Doxy+Rifampin x7 days PTA with no improvement  9/7: Wound (RLE swab): SA 9/7: Wound (RLE deep): SA  Dose changes/levels: 9/9: VT =13 on 750mg  IV q8h-->cont same dose  9/7 2340 spoke with Schorr NP about pt with rash to Vancomycin.  NP wants to try Benadryl and slow infusion down and watch pt closely.  9/8: Spoke with patient- no issues with  subsequent doses.  Continue to infuse Vanc over 2 hrs & premed with Benadryl.  Goal of Therapy:  Vancomycin trough level 15-20 mcg/ml  Appropriate antibiotic dosing for renal function; eradication of infection  Plan:  Continue Vanc 750mg  IV q8h Continue Zosyn 3.375g IV Q8H infused over 4hrs. Check weekly Vanc trough  Follow up renal fxn, culture results, and clinical course De-escalate antibiotics as appropriate pending final cx results Duration of therapy per ID  Junita Push, PharmD, BCPS Pager: [phone]  09/25/2014,12:51 PM.

## 2014-09-25 NOTE — Progress Notes (Signed)
[facility] Progress Note     Subjective: Still having pain.  Tolerating diet, mobilizing.  Got in shower yesterday.  Elevating her leg.  Constipated, wants something to help.  No N/V.    Objective: Vital signs in last 24 hours: Temp:  [98.2 F (36.8 C)-98.3 F (36.8 C)] 98.3 F (36.8 C) (09/09 0732) Pulse Rate:  [52-72] 64 (09/09 0732) Resp:  [17-18] 17 (09/09 0732) BP: (102-110)/(49-65) 110/65 mmHg (09/09 0732) SpO2:  [97 %-99 %] 99 % (09/09 0732) Last BM Date: 09/22/14  Intake/Output from previous day: 09/08 0701 - 09/09 0700 In: 240 [P.O.:240] Out: -  Intake/Output this shift:    PE: Gen:  Alert, NAD, pleasant Ext:  Right LE (just below knee) - Cellulitis is more patchy but streaking still noted above knee, peri-wound still induration, no fluctuance, edges have some dark black eschar which is minimizing with dressing changes   Lab Results:   Recent Labs  09/24/14 0535 09/25/14 0520  WBC 7.7 10.8*  HGB 12.9 11.6*  HCT 38.0 34.0*  PLT 140* 137*   BMET  Recent Labs  09/23/14 1719 09/24/14 0535  NA 136 139  K 4.5 4.4  CL 100* 106  CO2 29 25  GLUCOSE 103* 165*  BUN 5* 5*  CREATININE 0.72  0.73 0.73  CALCIUM 9.2 9.1   PT/INR No results for input(s): LABPROT, INR in the last 72 hours. CMP     Component Value Date/Time   NA 139 09/24/2014 0535   K 4.4 09/24/2014 0535   CL 106 09/24/2014 0535   CO2 25 09/24/2014 0535   GLUCOSE 165* 09/24/2014 0535   BUN 5* 09/24/2014 0535   CREATININE 0.73 09/24/2014 0535   CALCIUM 9.1 09/24/2014 0535   PROT 8.0 09/23/2014 1719   ALBUMIN 4.1 09/23/2014 1719   AST 25 09/23/2014 1719   ALT 21 09/23/2014 1719   ALKPHOS 54 09/23/2014 1719   BILITOT 0.8 09/23/2014 1719   GFRNONAA >60 09/24/2014 0535   GFRAA >60 09/24/2014 0535   Lipase  No results found for: LIPASE     Studies/Results: No results found.  Anti-infectives: Anti-infectives    Start     Dose/Rate Route Frequency Ordered Stop   09/24/14 0400  piperacillin-tazobactam (ZOSYN) IVPB 3.375 g     3.375 g 12.5 mL/hr over 240 Minutes Intravenous Every 8 hours 09/23/14 2105     09/24/14 0400  vancomycin (VANCOCIN) IVPB 750 mg/150 ml premix     750 mg 75 mL/hr over 120 Minutes Intravenous Every 8 hours 09/23/14 2108     09/23/14 1945  piperacillin-tazobactam (ZOSYN) IVPB 3.375 g     3.375 g 100 mL/hr over 30 Minutes Intravenous  Once 09/23/14 1937 09/23/14 2311   09/23/14 1830  vancomycin (VANCOCIN) IVPB 1000 mg/200 mL premix     1,000 mg 200 mL/hr over 60 Minutes Intravenous  Once 09/23/14 1829 09/23/14 1946      09/23/14       09/25/14   Assessment/Plan Soft tissue infection right lower extremity from suspected insect bite -One month duration. Progressive despite outpatient antibiotic therapy. -Gram stain shows gram-positive organism, awaiting cultures -On vancomycin and Zosyn, Continue IV antibiotics for now, await ID's recs. -Wound is cleaning up, no need for surgery at this point -Wound care BID and elevation  -Called infectious disease consult - Talked with Dr. Orvan Falconer -Try ice to swelling but not directly over wound -Stool softeners, miralax prn    LOS: 2 days    [address]  Meredith Leeds 09/25/2014, 7:49 AM Pager: [phone]  .

## 2014-09-25 NOTE — Consult Note (Signed)
[facility]    Date of Admission:  09/23/2014    Total days of antibiotics 4        Day 3 vancomycin              Reason for Consult: Staph aureus abscess of right leg    Referring Physician: Dr. Claud Kelp Primary Care Physician: Dr. Selena Batten  Principal Problem:   Cellulitis and abscess of leg Active Problems:   Thrombocytopenia   . diphenhydrAMINE  25 mg Intravenous Q8H  . docusate sodium  100 mg Oral BID  . enoxaparin (LOVENOX) injection  40 mg Subcutaneous Q24H  . vancomycin  750 mg Intravenous Q8H    Recommendations: 1. Continue vancomycin pending antibiotics susceptibility results   Assessment: She has a large staph aureus phlegmon/abscess that is improving on vancomycin therapy after recent incision and drainage. If she continues to improve overnight suspect she will be a candidate for transitioning to oral antibiotics therapy and discharge home tomorrow.    HPI: Margaret Woods is a 29 y.o. female with a history of recurrent pustular folliculitis and 1 boil requiring incision and drainage previously. About one month ago she developed a painful, red raised lesion on her right shin. She was treated with doxycycline for 7 days but the lesion persisted. She had 2 more rounds of doxycycline, the third in combination with rifampin. She noted that when she was on antibiotics she remained stable but when she came off antibiotics it would get worse. She was seen at [facility] on 09/22/2014 and the lesion was lanced. No cultures were obtained. She was started on clindamycin but the following morning the lesion was much bigger and more painful leading to admission on 09/23/2014. Wound drainage was cultured and is growing staph aureus. She was started on vancomycin and piperacillin tazobactam and has noted improvement.   Review of Systems: Constitutional: positive for anorexia, chills and fevers, negative for sweats and weight  loss Eyes: negative Ears, nose, mouth, throat, and face: negative Respiratory: negative Cardiovascular: negative Gastrointestinal: positive for constipation and nausea, negative for vomiting Genitourinary:negative Integument/breast: as noted in history of present illness  History reviewed. No pertinent past medical history.  Social History  Substance Use Topics  . Smoking status: Current Every Day Smoker  . Smokeless tobacco: None  . Alcohol Use: No    No family history on file. Allergies  Allergen Reactions  . Vicodin [Hydrocodone-Acetaminophen] Other (See Comments)    "It makes me paranoid and angry"    OBJECTIVE: Blood pressure 116/73, pulse 71, temperature 98.3 F (36.8 C), temperature source Oral, resp. rate 18, height  (1.651 m), weight 140 lb (63.504 kg), SpO2 100 %. General: She is alert and in no distress Skin: No generalized rash Lungs: Clear Cor: Regular S1 and S2 with no murmurs Abdomen: Soft and nontender Right leg: Large, baseball size area of induration on her right proximal shin. There is overlying erythema. There is no drainage from the central incision. 90% of the area of erythema and induration is well inside the Ink markings placed on admission. There is one area of slight fluctuance outside of the margin at the 1:00 position  Lab Results Lab Results  Component Value Date   WBC 10.8* 09/25/2014   HGB 11.6* 09/25/2014   HCT 34.0* 09/25/2014   MCV 91.9 09/25/2014   PLT 137* 09/25/2014    Lab Results  Component Value Date  CREATININE 0.73 09/24/2014   BUN 5* 09/24/2014   NA 139 09/24/2014   K 4.4 09/24/2014   CL 106 09/24/2014   CO2 25 09/24/2014    Lab Results  Component Value Date   ALT 21 09/23/2014   AST 25 09/23/2014   ALKPHOS 54 09/23/2014   BILITOT 0.8 09/23/2014     Microbiology: Recent Results (from the past 240 hour(s))  Wound culture     Status: None (Preliminary result)   Collection Time: 09/23/14  6:53 PM  Result  Value Ref Range Status   Specimen Description LEG RIGHT  Final   Special Requests SWAB  Final   Gram Stain   Final    ABUNDANT WBC PRESENT, PREDOMINANTLY PMN NO SQUAMOUS EPITHELIAL CELLS SEEN MODERATE GRAM POSITIVE COCCI IN PAIRS Performed at [facility]    Culture   Final    MODERATE STAPHYLOCOCCUS AUREUS Note: RIFAMPIN AND GENTAMICIN SHOULD NOT BE USED AS SINGLE DRUGS FOR TREATMENT OF STAPH INFECTIONS. Performed at [facility]    Report Status PENDING  Incomplete  Wound culture     Status: None (Preliminary result)   Collection Time: 09/23/14  7:30 PM  Result Value Ref Range Status   Specimen Description WOUND R LEG DEEP  Final   Special Requests NONE  Final   Gram Stain   Final    RARE WBC PRESENT, PREDOMINANTLY PMN NO SQUAMOUS EPITHELIAL CELLS SEEN NO ORGANISMS SEEN Performed at [facility]    Culture   Final    MODERATE STAPHYLOCOCCUS AUREUS Note: RIFAMPIN AND GENTAMICIN SHOULD NOT BE USED AS SINGLE DRUGS FOR TREATMENT OF STAPH INFECTIONS. Performed at [facility]    Report Status PENDING  Incomplete    Cliffton Asters, MD [facility] for Infectious Disease [facility] [phone] pager   [phone] cell 09/25/2014, 2:12 PM

## 2014-09-25 NOTE — Progress Notes (Signed)
[facility] Progress Note   Margaret Woods  ZOX:[id number]  DOB: Sep 11, 1985  DOA: 09/23/2014 PCP: Gweneth Dimitri, MD  Brief narrative: Margaret Woods is a 29 y.o. female with h/o migraines who developed swelling and redness on her right leg about 3 wks ago. She was given 2 courses of Doxycycline 10 days each and a third course of Doxycycline and Rifampin for 7 days. The swelling and redness did not improve. About 2 days prior to admission. The following day the area was lanced at [facility] and she was placed on clindamycin. By the next day the redness had extended from a small localized area on the right leg is involved the entire anterior part of the right face and she began to develop fevers and chills.   Subjective: Continued Improvement in redness and pain.   Assessment/Plan: Principal Problem:   Cellulitis and abscess of leg -Rapid spread of cellulitis in 48 hours from a small circumscribed area below the knee to involving the entire lower leg- this occurred while on Clindamycin  -Failed 2 courses of doxycycline and one course of doxycycline plus rifampin-  Apparently there was a "knot" on her leg that never resolved with treatments  (? abscess that needed drainage?) - wound was not cultured  when it was drained initially- cultures sent on 9/7 obtained from swabbing the abscess cavity growing staph- surgery has requested an ID consult - Started vancomycin- Surgery added Zosyn - will d/c Zosyn today -WBC count has improved  -  fevers resolved -Dilaudid & oxycodone for pain  Active Problems:   Thrombocytopenia -Likely due to acute infection -follow  Anorexia -Also likely due to above- resolving   Migraines -When necessary Relpax ordered    Code Status:     Code Status Orders        Start     Ordered   09/23/14 1837  Full code   Continuous     09/23/14 1839     Family Communication: husband Disposition Plan: home in 1-2 days DVT  prophylaxis: Lovenox Consultants:Gen surgery Procedures: Appt with PCP: requested Antibiotics: Anti-infectives    Start     Dose/Rate Route Frequency Ordered Stop   09/24/14 0400  piperacillin-tazobactam (ZOSYN) IVPB 3.375 g  Status:  Discontinued     3.375 g 12.5 mL/hr over 240 Minutes Intravenous Every 8 hours 09/23/14 2105 09/25/14 1057   09/24/14 0400  vancomycin (VANCOCIN) IVPB 750 mg/150 ml premix     750 mg 75 mL/hr over 120 Minutes Intravenous Every 8 hours 09/23/14 2108     09/23/14 1945  piperacillin-tazobactam (ZOSYN) IVPB 3.375 g     3.375 g 100 mL/hr over 30 Minutes Intravenous  Once 09/23/14 1937 09/23/14 2311   09/23/14 1830  vancomycin (VANCOCIN) IVPB 1000 mg/200 mL premix     1,000 mg 200 mL/hr over 60 Minutes Intravenous  Once 09/23/14 1829 09/23/14 1946      Objective: Filed Weights   09/23/14 1619  Weight: 63.504 kg (140 lb)    Intake/Output Summary (Last 24 hours) at 09/25/14 1455 Last data filed at 09/24/14 1918  Gross per 24 hour  Intake    240 ml  Output      0 ml  Net    240 ml     Vitals Filed Vitals:   09/25/14 0451 09/25/14 0732 09/25/14 1023 09/25/14 1338  BP: 102/49 110/65 111/64 116/73  Pulse: 52 64 62 71  Temp: 98.3 F (36.8 C) 98.3 F (36.8 C) 98.2  F (36.8 C) 98.3 F (36.8 C)  TempSrc: Oral Oral Oral Oral  Resp: 18 17 18 18   Height:      Weight:      SpO2: 98% 99% 100% 100%    Exam:  General:  Pt is alert, not in acute distress  HEENT: No icterus, No thrush, oral mucosa moist  Cardiovascular: regular rate and rhythm, S1/S2 No murmur  Respiratory: clear to auscultation bilaterally   Abdomen: Soft, +Bowel sounds, non tender, non distended, no guarding  MSK: No cyanosis or clubbing- right leg erythema is improving-still has necrotic skin around abscess cavity-small amount of drainage of blood on the bandage  Data Reviewed: Basic Metabolic Panel:  Recent Labs Lab 09/22/14 1330 09/23/14 1719 09/24/14 0535  NA 140  136 139  K 4.0 4.5 4.4  CL 103 100* 106  CO2 28 29 25   GLUCOSE 91 103* 165*  BUN 7 5* 5*  CREATININE 0.70 0.72  0.73 0.73  CALCIUM 9.4 9.2 9.1   Liver Function Tests:  Recent Labs Lab 09/22/14 1330 09/23/14 1719  AST 22 25  ALT 15 21  ALKPHOS 51 54  BILITOT 0.7 0.8  PROT 8.2* 8.0  ALBUMIN 4.6 4.1   No results for input(s): LIPASE, AMYLASE in the last 168 hours. No results for input(s): AMMONIA in the last 168 hours. CBC:  Recent Labs Lab 09/22/14 1330 09/23/14 1719 09/24/14 0535 09/25/14 0520  WBC 12.9* 9.8 7.7 10.8*  NEUTROABS 9.9* 8.1*  --  8.4*  HGB 14.6 14.0 12.9 11.6*  HCT 42.9 40.8 38.0 34.0*  MCV 91.1 92.3 92.7 91.9  PLT 177 139* 140* 137*   Cardiac Enzymes: No results for input(s): CKTOTAL, CKMB, CKMBINDEX, TROPONINI in the last 168 hours. BNP (last 3 results) No results for input(s): BNP in the last 8760 hours.  ProBNP (last 3 results) No results for input(s): PROBNP in the last 8760 hours.  CBG: No results for input(s): GLUCAP in the last 168 hours.  Recent Results (from the past 240 hour(s))  Wound culture     Status: None (Preliminary result)   Collection Time: 09/23/14  6:53 PM  Result Value Ref Range Status   Specimen Description LEG RIGHT  Final   Special Requests SWAB  Final   Gram Stain   Final    ABUNDANT WBC PRESENT, PREDOMINANTLY PMN NO SQUAMOUS EPITHELIAL CELLS SEEN MODERATE GRAM POSITIVE COCCI IN PAIRS Performed at [facility]    Culture   Final    MODERATE STAPHYLOCOCCUS AUREUS Note: RIFAMPIN AND GENTAMICIN SHOULD NOT BE USED AS SINGLE DRUGS FOR TREATMENT OF STAPH INFECTIONS. Performed at [facility]    Report Status PENDING  Incomplete  Wound culture     Status: None (Preliminary result)   Collection Time: 09/23/14  7:30 PM  Result Value Ref Range Status   Specimen Description WOUND R LEG DEEP  Final   Special Requests NONE  Final   Gram Stain   Final    RARE WBC PRESENT, PREDOMINANTLY PMN NO  SQUAMOUS EPITHELIAL CELLS SEEN NO ORGANISMS SEEN Performed at [facility]    Culture   Final    MODERATE STAPHYLOCOCCUS AUREUS Note: RIFAMPIN AND GENTAMICIN SHOULD NOT BE USED AS SINGLE DRUGS FOR TREATMENT OF STAPH INFECTIONS. Performed at [facility]    Report Status PENDING  Incomplete     Studies: No results found.  Scheduled Meds:  Scheduled Meds: . diphenhydrAMINE  25 mg Intravenous Q8H  . docusate sodium  100 mg Oral BID  . enoxaparin (LOVENOX) injection  40 mg Subcutaneous Q24H  . vancomycin  750 mg Intravenous Q8H   Continuous Infusions:   Time spent on care of this patient: 30 min   Kathryn Cosby, MD 09/25/2014, 2:55 PM  LOS: 2 days   [facility] Office  [phone] Pager - Text Page per [email or web address] If 7PM-7AM, please contact night-coverage [email or web address]

## 2014-09-26 DIAGNOSIS — R630 Anorexia: Secondary | ICD-10-CM

## 2014-09-26 LAB — WOUND CULTURE

## 2014-09-26 MED ORDER — DICLOXACILLIN SODIUM 500 MG PO CAPS: 500.0000 mg | ORAL_CAPSULE | Freq: Four times a day (QID) | ORAL | Status: DC

## 2014-09-26 MED ORDER — IBUPROFEN 200 MG PO TABS: 400.0000 mg | ORAL_TABLET | ORAL | Status: DC | PRN

## 2014-09-26 NOTE — Discharge Summary (Addendum)
Physician Discharge Summary  Margaret Woods:[id number] DOB: 1985-04-08 DOA: 09/23/2014  PCP: Gweneth Dimitri, MD  Admit date: 09/23/2014 Discharge date: 09/26/2014  Time spent: 60 minutes  Recommendations for Outpatient Follow-up:  1. See PCP in 4-5 days  2. F/u with surgery in 2 wks for wound check 3. CBC in 1 wk for thrombocytopenia  Discharge Condition: stable    Discharge Diagnoses:  Principal Problem:   Cellulitis and abscess of leg Active Problems:   Thrombocytopenia  Migraines  History of present illness:  Margaret Woods is a 29 y.o. female with h/o migraines who developed swelling and redness on her right leg about 3 wks ago. She was given 2 courses of Doxycycline 10 days each and a third course of Doxycycline and Rifampin for 7 days. The swelling and redness did not improve. About 2 days prior to admission. The following day the area was lanced at [facility] and she was placed on clindamycin. By the next day the redness had extended from a small localized area on the right leg is involved the entire anterior part of the right face and she began to develop fevers and chills.  Hospital Course:  Principal Problem:  Cellulitis and abscess of leg -Rapid spread of cellulitis in 48 hours from a small circumscribed area below the knee to involving the entire lower leg- this occurred while on Clindamycin  -Failed 2 courses of doxycycline and one course of doxycycline plus rifampin- Apparently there was a "knot" on her leg that never resolved with treatments (? abscess that needed drainage?) - wound was not cultured when it was drained initially- cultures sent on 9/7 obtained from swabbing the abscess cavity growing MSSA - surgery has requested an ID consult- recommended to transition to orals today - fevers and leukocytosis resolved with antibiotics- cellulitis significantly improved - sensitivities reviewed - will place her on Dicloxacillin for 1 more  week- avoid Clinda due to high association with C Diff - erythema has completely resolved- mild bloody drainage on dressing - wound granulating well- wound care instructions given by surgery  Active Problems:   Thrombocytopenia -Likely due to acute infection -follow  Anorexia -Also likely due to above- resolved  Migraines -When necessary Relpax ordered  Consultations:  gen surgery  Discharge Exam: Filed Weights   09/23/14 1619  Weight: 63.504 kg (140 lb)   Filed Vitals:   09/26/14 0603  BP: 115/73  Pulse: 56  Temp: 98.3 F (36.8 C)  Resp: 18    General: AAO x 3, no distress Cardiovascular: RRR, no murmurs  Respiratory: clear to auscultation bilaterally GI: soft, non-tender, non-distended, bowel sound positive MSK: No cyanosis or clubbing- right leg erythema has resolved-still has necrotic skin around abscess cavity-small amount of drainage of blood on the bandage- tender area medial to wound (much better than yesterday)   Discharge Instructions You were cared for by a hospitalist during your hospital stay. If you have any questions about your discharge medications or the care you received while you were in the hospital after you are discharged, you can call the unit and asked to speak with the hospitalist on call if the hospitalist that took care of you is not available. Once you are discharged, your primary care physician will handle any further medical issues. Please note that NO REFILLS for any discharge medications will be authorized once you are discharged, as it is imperative that you return to your primary care physician (or establish a relationship with a primary care  physician if you do not have one) for your aftercare needs so that they can reassess your need for medications and monitor your lab values.  Discharge Instructions    Discharge instructions    Complete by:  As directed   See Dr Uvaldo Rising in 4-5 days.     Increase activity slowly    Complete by:  As  directed             Medication List    STOP taking these medications        clindamycin 150 MG capsule  Commonly known as:  CLEOCIN     HYDROcodone-acetaminophen 5-325 MG per tablet  Commonly known as:  NORCO/VICODIN     naproxen 500 MG tablet  Commonly known as:  NAPROSYN     oxyCODONE-acetaminophen 5-325 MG per tablet  Commonly known as:  PERCOCET/ROXICET      TAKE these medications        dicloxacillin 500 MG capsule  Commonly known as:  DYNAPEN  Take 1 capsule (500 mg total) by mouth 4 (four) times daily.     eletriptan 40 MG tablet  Commonly known as:  RELPAX  Take 40 mg by mouth as needed for migraine or headache. May repeat in 2 hours if headache persists or recurs.       Allergies  Allergen Reactions  . Vicodin [Hydrocodone-Acetaminophen] Other (See Comments)    "It makes me paranoid and angry"       Follow-up Information    Follow up with [facility] In 2 weeks.   Specialty:  General Surgery   Why:  Wound check - ask for the [facility]   Contact information:   [address] [place] [address] [phone]        The results of significant diagnostics from this hospitalization (including imaging, microbiology, ancillary and laboratory) are listed below for reference.    Significant Diagnostic Studies: Dg Tibia/fibula Right  09/22/2014   CLINICAL DATA:  Continued pain and swelling about the right lower leg just below the knee since a insect bite 2 weeks ago. Initial encounter.  EXAM: RIGHT TIBIA AND FIBULA - 2 VIEW  COMPARISON:  None.  FINDINGS: No radiopaque foreign body or soft tissue gas collection is identified. Soft tissues are unremarkable. All imaged bones and joints appear normal.  IMPRESSION: Negative exam.   Electronically Signed   By: Drusilla Kanner M.D.   On: 09/22/2014 13:57   Ct Tibia Fibula Right W Contrast  09/22/2014   CLINICAL DATA:  Insect bite on the upper leg 1 month ago. Worsening symptoms following  antibiotic therapy. Pain extending down the leg.  EXAM: CT OF THE LOWER RIGHT EXTREMITY WITH CONTRAST  TECHNIQUE: Multidetector CT imaging of the RIGHT leg was performed according to the standard protocol following intravenous contrast administration.  COMPARISON:  09/22/2014.  CONTRAST:  OMNIPAQUE IOHEXOL 300 MG/ML  SOLN  FINDINGS: A cutaneous abscess is present extending into the subcutaneous tissues of the proximal RIGHT leg. This is medial and anterior to the proximal tibial metaphysis and is probably palpable. This is in the area identified with cutaneous marker. The abscess measures 13 mm AP, 29 mm transverse and 42 mm craniocaudal. This demonstrates peripheral enhancement with central low attenuation, typical of abscess. No gas is present. Surrounding cellulitis is present. Neurovascular bundles are within normal limits. Negative for osteomyelitis. Flexor and extensor tendons at the ankle appear normal. Marrow signal appears normal. Visible portions of the RIGHT  knee are within normal limits.  IMPRESSION: Superficial proximal RIGHT leg medial cutaneous and subcutaneous abscess.   Electronically Signed   By: Andreas Newport M.D.   On: 09/22/2014 16:30    Microbiology: Recent Results (from the past 240 hour(s))  Wound culture     Status: None   Collection Time: 09/23/14  6:53 PM  Result Value Ref Range Status   Specimen Description LEG RIGHT  Final   Special Requests SWAB  Final   Gram Stain   Final    ABUNDANT WBC PRESENT, PREDOMINANTLY PMN NO SQUAMOUS EPITHELIAL CELLS SEEN MODERATE GRAM POSITIVE COCCI IN PAIRS Performed at [facility]    Culture   Final    MODERATE STAPHYLOCOCCUS AUREUS Note: RIFAMPIN AND GENTAMICIN SHOULD NOT BE USED AS SINGLE DRUGS FOR TREATMENT OF STAPH INFECTIONS. Performed at [facility]    Report Status 09/26/2014 FINAL  Final   Organism ID, Bacteria STAPHYLOCOCCUS AUREUS  Final      Susceptibility   Staphylococcus aureus - MIC*     CLINDAMYCIN <=0.25 SENSITIVE Sensitive     ERYTHROMYCIN <=0.25 SENSITIVE Sensitive     GENTAMICIN <=0.5 SENSITIVE Sensitive     LEVOFLOXACIN <=0.12 SENSITIVE Sensitive     OXACILLIN <=0.25 SENSITIVE Sensitive     RIFAMPIN <=0.5 SENSITIVE Sensitive     TRIMETH/SULFA <=10 SENSITIVE Sensitive     VANCOMYCIN <=0.5 SENSITIVE Sensitive     TETRACYCLINE <=1 SENSITIVE Sensitive     MOXIFLOXACIN <=0.25 SENSITIVE Sensitive     * MODERATE STAPHYLOCOCCUS AUREUS  Wound culture     Status: None   Collection Time: 09/23/14  7:30 PM  Result Value Ref Range Status   Specimen Description WOUND R LEG DEEP  Final   Special Requests NONE  Final   Gram Stain   Final    RARE WBC PRESENT, PREDOMINANTLY PMN NO SQUAMOUS EPITHELIAL CELLS SEEN NO ORGANISMS SEEN Performed at [facility]    Culture   Final    MODERATE STAPHYLOCOCCUS AUREUS Note: RIFAMPIN AND GENTAMICIN SHOULD NOT BE USED AS SINGLE DRUGS FOR TREATMENT OF STAPH INFECTIONS. Performed at [facility]    Report Status 09/26/2014 FINAL  Final   Organism ID, Bacteria STAPHYLOCOCCUS AUREUS  Final      Susceptibility   Staphylococcus aureus - MIC*    CLINDAMYCIN <=0.25 SENSITIVE Sensitive     ERYTHROMYCIN <=0.25 SENSITIVE Sensitive     GENTAMICIN <=0.5 SENSITIVE Sensitive     LEVOFLOXACIN <=0.12 SENSITIVE Sensitive     OXACILLIN <=0.25 SENSITIVE Sensitive     RIFAMPIN <=0.5 SENSITIVE Sensitive     TRIMETH/SULFA <=10 SENSITIVE Sensitive     VANCOMYCIN 1 SENSITIVE Sensitive     TETRACYCLINE <=1 SENSITIVE Sensitive     MOXIFLOXACIN <=0.25 SENSITIVE Sensitive     * MODERATE STAPHYLOCOCCUS AUREUS     Labs: Basic Metabolic Panel:  Recent Labs Lab 09/22/14 1330 09/23/14 1719 09/24/14 0535  NA 140 136 139  K 4.0 4.5 4.4  CL 103 100* 106  CO2 28 29 25   GLUCOSE 91 103* 165*  BUN 7 5* 5*  CREATININE 0.70 0.72  0.73 0.73  CALCIUM 9.4 9.2 9.1   Liver Function Tests:  Recent Labs Lab 09/22/14 1330 09/23/14 1719  AST  22 25  ALT 15 21  ALKPHOS 51 54  BILITOT 0.7 0.8  PROT 8.2* 8.0  ALBUMIN 4.6 4.1   No results for input(s): LIPASE, AMYLASE in the last 168 hours. No results for input(s): AMMONIA in  the last 168 hours. CBC:  Recent Labs Lab 09/22/14 1330 09/23/14 1719 09/24/14 0535 09/25/14 0520  WBC 12.9* 9.8 7.7 10.8*  NEUTROABS 9.9* 8.1*  --  8.4*  HGB 14.6 14.0 12.9 11.6*  HCT 42.9 40.8 38.0 34.0*  MCV 91.1 92.3 92.7 91.9  PLT 177 139* 140* 137*   Cardiac Enzymes: No results for input(s): CKTOTAL, CKMB, CKMBINDEX, TROPONINI in the last 168 hours. BNP: BNP (last 3 results) No results for input(s): BNP in the last 8760 hours.  ProBNP (last 3 results) No results for input(s): PROBNP in the last 8760 hours.  CBG: No results for input(s): GLUCAP in the last 168 hours.     SignedCalvert Cantor, MD [facility] 09/26/2014, 9:57 AM

## 2014-09-26 NOTE — Progress Notes (Signed)
Patient discharged.  Wound care provided.  Wet to dry dressing.  Patient leaving with one prescription.  Room air.  No complaints.  Patient arrived back up to unit this afternoon asking for a return to work letter.  Physician notified and prepared.  Picked up this afternoon.  Patient left at approximately 1050.

## 2014-09-26 NOTE — Progress Notes (Signed)
  Subjective: Cellulitis/induration much improved from yesterday Less tender  Cultures - staph aureus - sensitive  Objective: Vital signs in last 24 hours: Temp:  [98.2 F (36.8 C)-98.3 F (36.8 C)] 98.3 F (36.8 C) (09/10 0603) Pulse Rate:  [56-71] 56 (09/10 0603) Resp:  [18] 18 (09/10 0603) BP: (101-116)/(61-73) 115/73 mmHg (09/10 0603) SpO2:  [98 %-100 %] 99 % (09/10 0603) Last BM Date: 09/25/14  Intake/Output from previous day: 09/09 0701 - 09/10 0700 In: 1080 [P.O.:1080] Out: -  Intake/Output this shift:    Open wound on anterior right leg - some eschar at edges of wound; minimal erythema around wound   Lab Results:   Recent Labs  09/24/14 0535 09/25/14 0520  WBC 7.7 10.8*  HGB 12.9 11.6*  HCT 38.0 34.0*  PLT 140* 137*   BMET  Recent Labs  09/23/14 1719 09/24/14 0535  NA 136 139  K 4.5 4.4  CL 100* 106  CO2 29 25  GLUCOSE 103* 165*  BUN 5* 5*  CREATININE 0.72  0.73 0.73  CALCIUM 9.2 9.1   PT/INR No results for input(s): LABPROT, INR in the last 72 hours. ABG No results for input(s): PHART, HCO3 in the last 72 hours.  Invalid input(s): PCO2, PO2  Studies/Results: No results found.  Anti-infectives: Anti-infectives    Start     Dose/Rate Route Frequency Ordered Stop   09/24/14 0400  piperacillin-tazobactam (ZOSYN) IVPB 3.375 g  Status:  Discontinued     3.375 g 12.5 mL/hr over 240 Minutes Intravenous Every 8 hours 09/23/14 2105 09/25/14 1057   09/24/14 0400  vancomycin (VANCOCIN) IVPB 750 mg/150 ml premix     750 mg 75 mL/hr over 120 Minutes Intravenous Every 8 hours 09/23/14 2108     09/23/14 1945  piperacillin-tazobactam (ZOSYN) IVPB 3.375 g     3.375 g 100 mL/hr over 30 Minutes Intravenous  Once 09/23/14 1937 09/23/14 2311   09/23/14 1830  vancomycin (VANCOCIN) IVPB 1000 mg/200 mL premix     1,000 mg 200 mL/hr over 60 Minutes Intravenous  Once 09/23/14 1829 09/23/14 1946      Assessment/Plan: s/p * No surgery found * Right  leg abscess - improving  Patient to be discharged today on PO abx. Follow-up arranged.    LOS: 3 days    Alfhild Partch K. 09/26/2014

## 2014-12-22 DIAGNOSIS — R21 Rash and other nonspecific skin eruption: Secondary | ICD-10-CM | POA: Insufficient documentation

## 2014-12-23 ENCOUNTER — Other Ambulatory Visit: Payer: Self-pay | Admitting: Obstetrics and Gynecology

## 2014-12-23 ENCOUNTER — Other Ambulatory Visit (HOSPITAL_COMMUNITY)
Admission: RE | Admit: 2014-12-23 | Discharge: 2014-12-23 | Disposition: A | Discharge status: Home / Self Care | Payer: BLUE CROSS/BLUE SHIELD | Source: Ambulatory Visit | Attending: Obstetrics and Gynecology | Admitting: Obstetrics and Gynecology

## 2014-12-23 DIAGNOSIS — Z01419 Encounter for gynecological examination (general) (routine) without abnormal findings: Secondary | ICD-10-CM | POA: Diagnosis not present

## 2014-12-25 LAB — CYTOLOGY - PAP

## 2015-01-13 ENCOUNTER — Encounter (HOSPITAL_BASED_OUTPATIENT_CLINIC_OR_DEPARTMENT_OTHER): Payer: BLUE CROSS/BLUE SHIELD

## 2015-05-20 DIAGNOSIS — B354 Tinea corporis: Secondary | ICD-10-CM | POA: Diagnosis not present

## 2015-06-11 DIAGNOSIS — B379 Candidiasis, unspecified: Secondary | ICD-10-CM | POA: Diagnosis not present

## 2015-06-11 DIAGNOSIS — B354 Tinea corporis: Secondary | ICD-10-CM | POA: Diagnosis not present

## 2015-06-25 DIAGNOSIS — L0889 Other specified local infections of the skin and subcutaneous tissue: Secondary | ICD-10-CM | POA: Diagnosis not present

## 2015-06-25 DIAGNOSIS — L0101 Non-bullous impetigo: Secondary | ICD-10-CM | POA: Diagnosis not present

## 2015-06-25 DIAGNOSIS — R21 Rash and other nonspecific skin eruption: Secondary | ICD-10-CM | POA: Diagnosis not present

## 2015-06-25 DIAGNOSIS — L738 Other specified follicular disorders: Secondary | ICD-10-CM | POA: Diagnosis not present

## 2015-08-02 DIAGNOSIS — O200 Threatened abortion: Secondary | ICD-10-CM | POA: Diagnosis not present

## 2015-08-03 DIAGNOSIS — O200 Threatened abortion: Secondary | ICD-10-CM | POA: Diagnosis not present

## 2015-08-03 DIAGNOSIS — O209 Hemorrhage in early pregnancy, unspecified: Secondary | ICD-10-CM | POA: Diagnosis not present

## 2015-08-06 DIAGNOSIS — Z3480 Encounter for supervision of other normal pregnancy, unspecified trimester: Secondary | ICD-10-CM | POA: Diagnosis not present

## 2015-08-06 LAB — OB RESULTS CONSOLE HIV ANTIBODY (ROUTINE TESTING): HIV: NONREACTIVE

## 2015-08-06 LAB — OB RESULTS CONSOLE ANTIBODY SCREEN: Antibody Screen: NEGATIVE

## 2015-08-06 LAB — OB RESULTS CONSOLE GC/CHLAMYDIA
CHLAMYDIA, DNA PROBE: NEGATIVE
GC PROBE AMP, GENITAL: NEGATIVE

## 2015-08-06 LAB — OB RESULTS CONSOLE ABO/RH: RH TYPE: POSITIVE

## 2015-08-06 LAB — OB RESULTS CONSOLE RUBELLA ANTIBODY, IGM: Rubella: IMMUNE

## 2015-08-06 LAB — OB RESULTS CONSOLE RPR: RPR: NONREACTIVE

## 2015-08-06 LAB — OB RESULTS CONSOLE HEPATITIS B SURFACE ANTIGEN: Hepatitis B Surface Ag: NEGATIVE

## 2015-09-07 DIAGNOSIS — Z3480 Encounter for supervision of other normal pregnancy, unspecified trimester: Secondary | ICD-10-CM | POA: Diagnosis not present

## 2015-10-07 DIAGNOSIS — Z3480 Encounter for supervision of other normal pregnancy, unspecified trimester: Secondary | ICD-10-CM | POA: Diagnosis not present

## 2015-11-02 DIAGNOSIS — Z3482 Encounter for supervision of other normal pregnancy, second trimester: Secondary | ICD-10-CM | POA: Diagnosis not present

## 2015-11-02 DIAGNOSIS — Z36 Encounter for antenatal screening for chromosomal anomalies: Secondary | ICD-10-CM | POA: Diagnosis not present

## 2015-11-30 DIAGNOSIS — Z23 Encounter for immunization: Secondary | ICD-10-CM | POA: Diagnosis not present

## 2015-12-03 DIAGNOSIS — J029 Acute pharyngitis, unspecified: Secondary | ICD-10-CM | POA: Diagnosis not present

## 2015-12-30 DIAGNOSIS — Z3480 Encounter for supervision of other normal pregnancy, unspecified trimester: Secondary | ICD-10-CM | POA: Diagnosis not present

## 2016-01-17 NOTE — L&D Delivery Note (Signed)
Delivery Note At 4:22 PM a viable female was delivered via Vaginal, Spontaneous Delivery (Presentation: occiput posterior ;  ).  APGAR: 9, 9; weight 8 lb 11 oz (3941 g).   Placenta status: ,complete 3VC .  Cord:  with the following complications:None .  Cord pH: NA  Anesthesia:  Epidural  Episiotomy: None Lacerations: Periurethral Suture Repair: 3.0 vicryl Est. Blood Loss (mL): 350  Mom to postpartum.  Baby to Couplet care / Skin to Skin.  Steve Gregg J. 03/28/2016, 6:02 PM

## 2016-01-20 DIAGNOSIS — H9202 Otalgia, left ear: Secondary | ICD-10-CM | POA: Diagnosis not present

## 2016-01-20 DIAGNOSIS — J40 Bronchitis, not specified as acute or chronic: Secondary | ICD-10-CM | POA: Diagnosis not present

## 2016-01-21 DIAGNOSIS — Z23 Encounter for immunization: Secondary | ICD-10-CM | POA: Diagnosis not present

## 2016-02-10 DIAGNOSIS — Z3483 Encounter for supervision of other normal pregnancy, third trimester: Secondary | ICD-10-CM | POA: Diagnosis not present

## 2016-02-10 DIAGNOSIS — Z3482 Encounter for supervision of other normal pregnancy, second trimester: Secondary | ICD-10-CM | POA: Diagnosis not present

## 2016-02-29 DIAGNOSIS — Z3483 Encounter for supervision of other normal pregnancy, third trimester: Secondary | ICD-10-CM | POA: Diagnosis not present

## 2016-02-29 LAB — OB RESULTS CONSOLE GBS: STREP GROUP B AG: NEGATIVE

## 2016-03-08 DIAGNOSIS — Z3483 Encounter for supervision of other normal pregnancy, third trimester: Secondary | ICD-10-CM | POA: Diagnosis not present

## 2016-03-23 DIAGNOSIS — O368130 Decreased fetal movements, third trimester, not applicable or unspecified: Secondary | ICD-10-CM | POA: Diagnosis not present

## 2016-03-24 ENCOUNTER — Telehealth (HOSPITAL_COMMUNITY): Payer: Self-pay | Admitting: *Deleted

## 2016-03-24 ENCOUNTER — Encounter (HOSPITAL_COMMUNITY): Payer: Self-pay | Admitting: *Deleted

## 2016-03-24 NOTE — Telephone Encounter (Signed)
Preadmission screen  

## 2016-03-25 ENCOUNTER — Other Ambulatory Visit: Payer: Self-pay | Admitting: Obstetrics and Gynecology

## 2016-03-27 ENCOUNTER — Inpatient Hospital Stay (HOSPITAL_COMMUNITY): Payer: BLUE CROSS/BLUE SHIELD | Admitting: Anesthesiology

## 2016-03-27 ENCOUNTER — Encounter (HOSPITAL_COMMUNITY): Payer: Self-pay

## 2016-03-27 ENCOUNTER — Inpatient Hospital Stay (HOSPITAL_COMMUNITY)
Admission: RE | Admit: 2016-03-27 | Discharge: 2016-03-28 | DRG: 774 | Disposition: A | Discharge status: Home / Self Care | Payer: BLUE CROSS/BLUE SHIELD | Source: Ambulatory Visit | Attending: Obstetrics and Gynecology | Admitting: Obstetrics and Gynecology

## 2016-03-27 DIAGNOSIS — Z3490 Encounter for supervision of normal pregnancy, unspecified, unspecified trimester: Secondary | ICD-10-CM

## 2016-03-27 DIAGNOSIS — F17200 Nicotine dependence, unspecified, uncomplicated: Secondary | ICD-10-CM | POA: Diagnosis not present

## 2016-03-27 DIAGNOSIS — O99334 Smoking (tobacco) complicating childbirth: Secondary | ICD-10-CM | POA: Diagnosis present

## 2016-03-27 DIAGNOSIS — O3663X0 Maternal care for excessive fetal growth, third trimester, not applicable or unspecified: Secondary | ICD-10-CM | POA: Diagnosis not present

## 2016-03-27 DIAGNOSIS — Z3A40 40 weeks gestation of pregnancy: Secondary | ICD-10-CM

## 2016-03-27 DIAGNOSIS — Z8249 Family history of ischemic heart disease and other diseases of the circulatory system: Secondary | ICD-10-CM

## 2016-03-27 DIAGNOSIS — Z23 Encounter for immunization: Secondary | ICD-10-CM | POA: Diagnosis not present

## 2016-03-27 DIAGNOSIS — Z3493 Encounter for supervision of normal pregnancy, unspecified, third trimester: Secondary | ICD-10-CM | POA: Diagnosis not present

## 2016-03-27 LAB — CBC
HCT: 39.3 % (ref 36.0–46.0)
HCT: 39.9 % (ref 36.0–46.0)
HEMOGLOBIN: 13.7 g/dL (ref 12.0–15.0)
HEMOGLOBIN: 13.7 g/dL (ref 12.0–15.0)
MCH: 31.7 pg (ref 26.0–34.0)
MCH: 31.4 pg (ref 26.0–34.0)
MCHC: 34.9 g/dL (ref 30.0–36.0)
MCHC: 34.3 g/dL (ref 30.0–36.0)
MCV: 91 fL (ref 78.0–100.0)
MCV: 91.5 fL (ref 78.0–100.0)
Platelets: 120 10*3/uL — ABNORMAL LOW (ref 150–400)
Platelets: 122 10*3/uL — ABNORMAL LOW (ref 150–400)
RBC: 4.32 MIL/uL (ref 3.87–5.11)
RBC: 4.36 MIL/uL (ref 3.87–5.11)
RDW: 13.3 % (ref 11.5–15.5)
RDW: 13.3 % (ref 11.5–15.5)
WBC: 7.6 10*3/uL (ref 4.0–10.5)
WBC: 15.5 10*3/uL — AB (ref 4.0–10.5)

## 2016-03-27 LAB — TYPE AND SCREEN
ABO/RH(D): O POS
Antibody Screen: NEGATIVE

## 2016-03-27 MED ORDER — SIMETHICONE 80 MG PO CHEW: 80.0000 mg | CHEWABLE_TABLET | ORAL | Status: DC | PRN

## 2016-03-27 MED ORDER — PHENYLEPHRINE 40 MCG/ML (10ML) SYRINGE FOR IV PUSH (FOR BLOOD PRESSURE SUPPORT)
80.0000 ug | PREFILLED_SYRINGE | INTRAVENOUS | Status: DC | PRN
  Filled 2016-03-27: qty 5
  Filled 2016-03-27: qty 10

## 2016-03-27 MED ORDER — LACTATED RINGERS IV SOLN
INTRAVENOUS | Status: DC
  Administered 2016-03-27 (×2): 125 mL/h via INTRAVENOUS

## 2016-03-27 MED ORDER — PHENYLEPHRINE 40 MCG/ML (10ML) SYRINGE FOR IV PUSH (FOR BLOOD PRESSURE SUPPORT)
80.0000 ug | PREFILLED_SYRINGE | INTRAVENOUS | Status: DC | PRN
  Filled 2016-03-27: qty 5

## 2016-03-27 MED ORDER — PRENATAL MULTIVITAMIN CH
1.0000 | ORAL_TABLET | Freq: Every day | ORAL | Status: DC
  Administered 2016-03-28: 1 via ORAL
  Filled 2016-03-27: qty 1

## 2016-03-27 MED ORDER — IBUPROFEN 600 MG PO TABS
600.0000 mg | ORAL_TABLET | Freq: Four times a day (QID) | ORAL | Status: DC
  Administered 2016-03-28 (×4): 600 mg via ORAL
  Filled 2016-03-27 (×4): qty 1

## 2016-03-27 MED ORDER — EPHEDRINE 5 MG/ML INJ
10.0000 mg | INTRAVENOUS | Status: DC | PRN
Start: 2016-03-27 — End: 2016-03-27

## 2016-03-27 MED ORDER — DIBUCAINE 1 % RE OINT: 1.0000 "application " | TOPICAL_OINTMENT | RECTAL | Status: DC | PRN

## 2016-03-27 MED ORDER — MISOPROSTOL 200 MCG PO TABS
ORAL_TABLET | ORAL | Status: AC
  Filled 2016-03-27: qty 5

## 2016-03-27 MED ORDER — LACTATED RINGERS IV SOLN: 500.0000 mL | Freq: Once | INTRAVENOUS | Status: DC

## 2016-03-27 MED ORDER — PHENYLEPHRINE 40 MCG/ML (10ML) SYRINGE FOR IV PUSH (FOR BLOOD PRESSURE SUPPORT): 80.0000 ug | PREFILLED_SYRINGE | INTRAVENOUS | Status: DC | PRN

## 2016-03-27 MED ORDER — BENZOCAINE-MENTHOL 20-0.5 % EX AERO
1.0000 "application " | INHALATION_SPRAY | CUTANEOUS | Status: DC | PRN
  Administered 2016-03-28: 1 via TOPICAL
  Filled 2016-03-27 (×2): qty 56

## 2016-03-27 MED ORDER — EPHEDRINE 5 MG/ML INJ
10.0000 mg | INTRAVENOUS | Status: DC | PRN
  Filled 2016-03-27: qty 4

## 2016-03-27 MED ORDER — ONDANSETRON HCL 4 MG/2ML IJ SOLN
4.0000 mg | Freq: Four times a day (QID) | INTRAMUSCULAR | Status: DC | PRN
  Administered 2016-03-27: 4 mg via INTRAVENOUS
  Filled 2016-03-27: qty 2

## 2016-03-27 MED ORDER — METHYLERGONOVINE MALEATE 0.2 MG/ML IJ SOLN
0.2000 mg | Freq: Once | INTRAMUSCULAR | Status: AC
  Administered 2016-03-27: 0.2 mg via INTRAMUSCULAR

## 2016-03-27 MED ORDER — OXYCODONE HCL 5 MG PO TABS: 5.0000 mg | ORAL_TABLET | ORAL | Status: DC | PRN

## 2016-03-27 MED ORDER — BUPIVACAINE HCL (PF) 0.25 % IJ SOLN
INTRAMUSCULAR | Status: DC | PRN
  Administered 2016-03-27 (×2): 5 mL via EPIDURAL

## 2016-03-27 MED ORDER — SENNOSIDES-DOCUSATE SODIUM 8.6-50 MG PO TABS
2.0000 | ORAL_TABLET | ORAL | Status: DC
  Administered 2016-03-28: 2 via ORAL
  Filled 2016-03-27: qty 2

## 2016-03-27 MED ORDER — ONDANSETRON HCL 4 MG/2ML IJ SOLN: 4.0000 mg | INTRAMUSCULAR | Status: DC | PRN

## 2016-03-27 MED ORDER — OXYTOCIN 40 UNITS IN LACTATED RINGERS INFUSION - SIMPLE MED: 2.5000 [IU]/h | INTRAVENOUS | Status: DC

## 2016-03-27 MED ORDER — COCONUT OIL OIL
1.0000 "application " | TOPICAL_OIL | Status: DC | PRN
  Administered 2016-03-28: 1 via TOPICAL
  Filled 2016-03-27: qty 120

## 2016-03-27 MED ORDER — METHYLERGONOVINE MALEATE 0.2 MG PO TABS: 0.2000 mg | ORAL_TABLET | ORAL | Status: DC | PRN

## 2016-03-27 MED ORDER — METHYLERGONOVINE MALEATE 0.2 MG/ML IJ SOLN: 0.2000 mg | INTRAMUSCULAR | Status: DC | PRN

## 2016-03-27 MED ORDER — ACETAMINOPHEN 325 MG PO TABS: 650.0000 mg | ORAL_TABLET | ORAL | Status: DC | PRN

## 2016-03-27 MED ORDER — DIPHENHYDRAMINE HCL 25 MG PO CAPS: 25.0000 mg | ORAL_CAPSULE | Freq: Four times a day (QID) | ORAL | Status: DC | PRN

## 2016-03-27 MED ORDER — OXYTOCIN BOLUS FROM INFUSION
500.0000 mL | Freq: Once | INTRAVENOUS | Status: AC
  Administered 2016-03-27: 500 mL via INTRAVENOUS

## 2016-03-27 MED ORDER — SOD CITRATE-CITRIC ACID 500-334 MG/5ML PO SOLN: 30.0000 mL | ORAL | Status: DC | PRN

## 2016-03-27 MED ORDER — OXYCODONE HCL 5 MG PO TABS: 10.0000 mg | ORAL_TABLET | ORAL | Status: DC | PRN

## 2016-03-27 MED ORDER — OXYTOCIN 40 UNITS IN LACTATED RINGERS INFUSION - SIMPLE MED
1.0000 m[IU]/min | INTRAVENOUS | Status: DC
  Administered 2016-03-27: 2 m[IU]/min via INTRAVENOUS
  Filled 2016-03-27: qty 1000

## 2016-03-27 MED ORDER — FENTANYL CITRATE (PF) 100 MCG/2ML IJ SOLN
50.0000 ug | INTRAMUSCULAR | Status: DC | PRN
  Administered 2016-03-27: 50 ug via INTRAVENOUS
  Filled 2016-03-27: qty 2

## 2016-03-27 MED ORDER — TERBUTALINE SULFATE 1 MG/ML IJ SOLN
0.2500 mg | Freq: Once | INTRAMUSCULAR | Status: DC | PRN
  Filled 2016-03-27: qty 1

## 2016-03-27 MED ORDER — DIPHENHYDRAMINE HCL 50 MG/ML IJ SOLN: 12.5000 mg | INTRAMUSCULAR | Status: DC | PRN

## 2016-03-27 MED ORDER — OXYCODONE-ACETAMINOPHEN 5-325 MG PO TABS
1.0000 | ORAL_TABLET | ORAL | Status: DC | PRN
  Administered 2016-03-27: 1 via ORAL
  Filled 2016-03-27: qty 1

## 2016-03-27 MED ORDER — FERROUS SULFATE 325 (65 FE) MG PO TABS
325.0000 mg | ORAL_TABLET | Freq: Two times a day (BID) | ORAL | Status: DC
  Administered 2016-03-28 (×2): 325 mg via ORAL
  Filled 2016-03-27 (×3): qty 1

## 2016-03-27 MED ORDER — OXYCODONE-ACETAMINOPHEN 5-325 MG PO TABS: 2.0000 | ORAL_TABLET | ORAL | Status: DC | PRN

## 2016-03-27 MED ORDER — ONDANSETRON HCL 4 MG PO TABS: 4.0000 mg | ORAL_TABLET | ORAL | Status: DC | PRN

## 2016-03-27 MED ORDER — WITCH HAZEL-GLYCERIN EX PADS: 1.0000 "application " | MEDICATED_PAD | CUTANEOUS | Status: DC | PRN

## 2016-03-27 MED ORDER — ZOLPIDEM TARTRATE 5 MG PO TABS: 5.0000 mg | ORAL_TABLET | Freq: Every evening | ORAL | Status: DC | PRN

## 2016-03-27 MED ORDER — LIDOCAINE HCL (PF) 1 % IJ SOLN
30.0000 mL | INTRAMUSCULAR | Status: DC | PRN
  Filled 2016-03-27: qty 30

## 2016-03-27 MED ORDER — FENTANYL 2.5 MCG/ML BUPIVACAINE 1/10 % EPIDURAL INFUSION (WH - ANES)
14.0000 mL/h | INTRAMUSCULAR | Status: DC | PRN
  Administered 2016-03-27: 14 mL/h via EPIDURAL
  Filled 2016-03-27: qty 100

## 2016-03-27 MED ORDER — LACTATED RINGERS IV SOLN
500.0000 mL | INTRAVENOUS | Status: DC | PRN
  Administered 2016-03-27: 500 mL via INTRAVENOUS

## 2016-03-27 MED ORDER — MISOPROSTOL 200 MCG PO TABS
1000.0000 ug | ORAL_TABLET | Freq: Once | ORAL | Status: AC
  Administered 2016-03-27: 1000 ug via RECTAL

## 2016-03-27 MED ORDER — LIDOCAINE HCL (PF) 1 % IJ SOLN
INTRAMUSCULAR | Status: DC | PRN
  Administered 2016-03-27: 3 mL
  Administered 2016-03-27: 2 mL
  Administered 2016-03-27: 5 mL

## 2016-03-27 NOTE — Progress Notes (Signed)
MOB was referred for history of depression/anxiety. * Referral screened out by Clinical Social Worker because none of the following criteria appear to apply: ~ History of anxiety/depression during this pregnancy, or of post-partum depression. ~ Diagnosis of anxiety and/or depression within last 3 years OR * MOB's symptoms currently being treated with medication and/or therapy. Please contact the Clinical Social Worker if needs arise, or if MOB requests.   

## 2016-03-27 NOTE — Anesthesia Procedure Notes (Signed)
Epidural Patient location during procedure: OB Start time: 03/27/2016 11:45 AM End time: 03/27/2016 11:54 AM  Staffing Anesthesiologist: Marcene DuosFITZGERALD, Revere Maahs Performed: anesthesiologist   Preanesthetic Checklist Completed: patient identified, site marked, surgical consent, pre-op evaluation, timeout performed, IV checked, risks and benefits discussed and monitors and equipment checked  Epidural Patient position: sitting Prep: site prepped and draped and DuraPrep Patient monitoring: continuous pulse ox and blood pressure Approach: midline Location: L4-L5 Injection technique: LOR air  Needle:  Needle type: Tuohy  Needle gauge: 17 G Needle length: 9 cm and 9 Needle insertion depth: 6 cm Catheter type: closed end flexible Catheter size: 19 Gauge Catheter at skin depth: 12 (11-->12 when laid in lateral decub positino.) cm Test dose: negative  Assessment Events: blood not aspirated, injection not painful, no injection resistance, negative IV test and no paresthesia

## 2016-03-27 NOTE — Progress Notes (Signed)
Notified by Dr. Richardson Doppole that pt was passing clots per vagina.  S/p Cytotec 1000 mcg per vagina.  EBL ~650 now, including delivery. Upon arrival, RN states she got out 1 liter. VSS Gen:  NAD, normal coloring. Fundus firm. Rt periurethral repair intact, no bleeding.  Vagina without other lacerations. Large clot palpated at internal os.  Clots manually extracted. Fundus firm  Baby weighed in room.  8 lbs 11 oz.  Uterine atony likely due to full bladder, LGA baby. No active bleeding. Monitor closely. Methergine prn. D/w pt.  All questions answered. Dr. Richardson Doppole updated. F/u CBC.

## 2016-03-27 NOTE — Progress Notes (Signed)
RN called Dr Dion BodyVarnado regarding deviation of the patient's uterus and patient's full bladder upon arrival to mother baby. The Patient 's uterus is firm but deviated to the right. Bleeding is small. Labor and delivery had just " in and out" cath the patient  for 800ml less than an hour ago. Prior to this time, Labor and delivery had " in and out cath" patient for 1000 ml.  Dr. Dion BodyVarnado ordered an indwelling catheter to be  placed in patient. In addition, Dr Dion BodyVarnado stated to call [facility] CNM to make her aware.   Rn notified Cle[facility] CNM and obtained an order for SCDs.

## 2016-03-27 NOTE — Lactation Note (Signed)
This note was copied from a baby's chart. Lactation Consultation Note  Assistance requested by RN related to flat nipples and mothers lack of success BF her older child.  Nipples are somewhat erect. Baby opened mouth wide and latched easily when a teacup hold was used. Many swallows were heard. Maternal condition did not yield itself to much teaching.  Follow-up tomorrow. Patient Name: Margaret Woods ZOXWR'UToday's Date: 03/27/2016     Maternal Data    Feeding Feeding Type: Breast Fed Length of feed: 10 min  LATCH Score/Interventions Latch: Repeated attempts needed to sustain latch, nipple held in mouth throughout feeding, stimulation needed to elicit sucking reflex. Intervention(s): Adjust position;Assist with latch;Breast compression  Audible Swallowing: None Intervention(s): Hand expression  Type of Nipple: Flat Intervention(s): Hand pump  Comfort (Breast/Nipple): Soft / non-tender     Hold (Positioning): Full assist, staff holds infant at breast Intervention(s): Breastfeeding basics reviewed;Support Pillows;Position options  LATCH Score: 4  Lactation Tools Discussed/Used     Consult Status      Margaret Woods, Margaret Woods 03/27/2016, 10:03 PM

## 2016-03-27 NOTE — H&P (Signed)
Margaret RidgeKatelin H Woods is a 31 y.o. female G2P1001 at 40 wks and 1 day presenting for elective induction of labor. Prenatal care provided by Dr. Gerald Leitzara Rondell Pardon. Pregnancy has been uncomplicated.   . OB History    Gravida Para Term Preterm AB Living   2 1 1          SAB TAB Ectopic Multiple Live Births                 Past Medical History:  Diagnosis Date  . Depression    medication in past   Past Surgical History:  Procedure Laterality Date  . KNEE SURGERY    . MANDIBLE FRACTURE SURGERY    . NOSE SURGERY     Family History: family history includes Hypertension in her father. Social History:  reports that she has been smoking.  She does not have any smokeless tobacco history on file. She reports that she does not drink alcohol or use drugs.     Maternal Diabetes: No Genetic Screening: Normal Maternal Ultrasounds/Referrals: Normal Fetal Ultrasounds or other Referrals:  None Maternal Substance Abuse:  No Significant Maternal Medications:  None Significant Maternal Lab Results:  Lab values include: Group B Strep negative Other Comments:  None  Review of Systems  Constitutional: Negative.   HENT: Negative.   Eyes: Negative.   Respiratory: Negative.   Cardiovascular: Negative.   Gastrointestinal: Negative.   Genitourinary: Negative.   Musculoskeletal: Negative.   Skin: Negative.   Neurological: Negative.   Endo/Heme/Allergies: Negative.   Psychiatric/Behavioral: Negative.    Maternal Medical History:  Fetal activity: Perceived fetal activity is normal.   Last perceived fetal movement was within the past hour.    Prenatal Complications - Diabetes: none.    Dilation: 4.5 Effacement (%): 90 Station: -1 Exam by:: Margaret HallJenny Kreh RN Blood pressure 124/80, pulse 60, temperature 98.6 F (37 C), temperature source Oral, resp. rate 16, height 5\' 5"  (1.651 m), weight 76.7 kg (169 lb), last menstrual period 06/20/2015. Maternal Exam:  Abdomen: Fetal presentation:  vertex  Introitus: Normal vulva. Normal vagina.    Fetal Exam Fetal Monitor Review: Baseline rate: 125.  Variability: moderate (6-25 bpm).   Pattern: accelerations present and no decelerations.    Fetal State Assessment: Category I - tracings are normal.     Physical Exam  Vitals reviewed. Constitutional: She is oriented to person, place, and time. She appears well-developed and well-nourished.  HENT:  Head: Normocephalic and atraumatic.  Eyes: Conjunctivae are normal. Pupils are equal, round, and reactive to light.  Neck: Normal range of motion. Neck supple.  Cardiovascular: Normal rate and regular rhythm.   Respiratory: Effort normal and breath sounds normal.  GI: There is no tenderness.  Genitourinary: Vagina normal.  Musculoskeletal: Normal range of motion. She exhibits no edema.  Neurological: She is alert and oriented to person, place, and time. She has normal reflexes.  Skin: Skin is warm and dry.  Psychiatric: She has a normal mood and affect.    Prenatal labs: ABO, Rh: --/--/O POS (03/12 0755) Antibody: NEG (03/12 0755) Rubella: Immune (07/21 0000) RPR: Nonreactive (07/21 0000)  HBsAg: Negative (07/21 0000)  HIV: Non-reactive (07/21 0000)  GBS: Negative (02/13 0000)   Assessment/Plan: 40 wks and 1 day for elective induction. Reviewed with patient increased r/o cesarean section associated with induction. She accepts this risk.  Pitocin for induction.  Anticipate SVD   Welles Walthall J. 03/27/2016, 1:34 PM

## 2016-03-27 NOTE — Anesthesia Pain Management Evaluation Note (Signed)
  CRNA Pain Management Visit Note  Patient: Margaret Woods, 31 y.o., female  "Hello I am a member of the anesthesia team at [facility]. We have an anesthesia team available at all times to provide care throughout the hospital, including epidural management and anesthesia for C-section. I don't know your plan for the delivery whether it a natural birth, water birth, IV sedation, nitrous supplementation, doula or epidural, but we want to meet your pain goals."   1.Was your pain managed to your expectations on prior hospitalizations?   Yes   2.What is your expectation for pain management during this hospitalization?     Epidural  3.How can we help you reach that goal?   Record the patient's initial score and the patient's pain goal.   Pain: 3  Pain Goal: 6 [facility] wants you to be able to say your pain was always managed very well.  Laban EmperorMalinova,Margaret Woods Hristova 03/27/2016

## 2016-03-27 NOTE — Anesthesia Preprocedure Evaluation (Signed)
Anesthesia Evaluation  Patient identified by MRN, date of birth, ID band Patient awake    Reviewed: Allergy & Precautions, Patient's Chart, lab work & pertinent test results  Airway Mallampati: II  TM Distance: >3 FB Neck ROM: Full    Dental   Pulmonary Current Smoker,    Pulmonary exam normal        Cardiovascular negative cardio ROS Normal cardiovascular exam     Neuro/Psych negative neurological ROS     GI/Hepatic negative GI ROS, Neg liver ROS,   Endo/Other  negative endocrine ROS  Renal/GU negative Renal ROS     Musculoskeletal   Abdominal   Peds  Hematology  (+) Blood dyscrasia, ,   Anesthesia Other Findings   Reproductive/Obstetrics (+) Pregnancy                             Lab Results  Component Value Date   WBC 7.6 03/27/2016   HGB 13.7 03/27/2016   HCT 39.3 03/27/2016   MCV 91.0 03/27/2016   PLT 120 (L) 03/27/2016   Lab Results  Component Value Date   CREATININE 0.73 09/24/2014   BUN 5 (L) 09/24/2014   NA 139 09/24/2014   K 4.4 09/24/2014   CL 106 09/24/2014   CO2 25 09/24/2014    Anesthesia Physical Anesthesia Plan  ASA: II  Anesthesia Plan: Epidural   Post-op Pain Management:    Induction:   Airway Management Planned: Natural Airway  Additional Equipment:   Intra-op Plan:   Post-operative Plan:   Informed Consent: I have reviewed the patients History and Physical, chart, labs and discussed the procedure including the risks, benefits and alternatives for the proposed anesthesia with the patient or authorized representative who has indicated his/her understanding and acceptance.     Plan Discussed with:   Anesthesia Plan Comments:         Anesthesia Quick Evaluation

## 2016-03-27 NOTE — Progress Notes (Signed)
Robie RidgeKatelin H Jaco is a 31 y.o. G2P1000 at 5466w1d by LMP admitted for induction of labor due to Elective at term.  Subjective: Patient is comfortable with her epidural. She reports Right collar bone pain since  Epidural was placed. SROM clear fluid this morning  Objective: BP 124/80 (BP Location: Right Arm)   Pulse 60   Temp 98.6 F (37 C) (Oral)   Resp 16   Ht 5\' 5"  (1.651 m)   Wt 76.7 kg (169 lb)   LMP 06/20/2015   BMI 28.12 kg/m  No intake/output data recorded. Total I/O In: -  Out: 300 [Urine:300]  FHT:  FHR: 120 bpm, variability: moderate,  accelerations:  Present,  decelerations:  Absent UC:   regular, every 2-3 minutes SVE:   4-5/70/-1 IUPC placed  Labs: Lab Results  Component Value Date   WBC 7.6 03/27/2016   HGB 13.7 03/27/2016   HCT 39.3 03/27/2016   MCV 91.0 03/27/2016   PLT 120 (L) 03/27/2016    Assessment / Plan: Induction of labor due to term with favorable cervix,  progressing well on pitocin  Labor: Progressing normally Preeclampsia:  NA Fetal Wellbeing:  Category I Pain Control:  Epidural I/D:  n/a Anticipated MOD:  NSVD  Airon Sahni J. 03/27/2016, 1:39 PM

## 2016-03-28 ENCOUNTER — Encounter (HOSPITAL_COMMUNITY): Payer: Self-pay

## 2016-03-28 LAB — CBC
HCT: 36.4 % (ref 36.0–46.0)
Hemoglobin: 12.6 g/dL (ref 12.0–15.0)
MCH: 31.3 pg (ref 26.0–34.0)
MCHC: 34.6 g/dL (ref 30.0–36.0)
MCV: 90.5 fL (ref 78.0–100.0)
PLATELETS: 106 10*3/uL — AB (ref 150–400)
RBC: 4.02 MIL/uL (ref 3.87–5.11)
RDW: 13.3 % (ref 11.5–15.5)
WBC: 14.2 10*3/uL — AB (ref 4.0–10.5)

## 2016-03-28 LAB — RPR: RPR: NONREACTIVE

## 2016-03-28 MED ORDER — IBUPROFEN 600 MG PO TABS: 600.0000 mg | ORAL_TABLET | Freq: Four times a day (QID) | ORAL | 1 refills | Status: DC | PRN

## 2016-03-28 NOTE — Progress Notes (Signed)
Post Partum Day 1SVD Subjective: no complaints, up ad lib, voiding and tolerating PO  Objective: Blood pressure 116/71, pulse (!) 56, temperature 97.9 F (36.6 C), resp. rate 18, height 5\' 5"  (1.651 m), weight 76.7 kg (169 lb), last menstrual period 06/20/2015, SpO2 100 %, unknown if currently breastfeeding.  Physical Exam:  General: alert and cooperative Lochia: appropriate Uterine Fundus: firm Incision: NA DVT Evaluation: No evidence of DVT seen on physical exam.   Recent Labs  03/27/16 1742 03/28/16 0519  HGB 13.7 12.6  HCT 39.9 36.4    Assessment/Plan: Discharge home, Breastfeeding and Circumcision prior to discharge  Patient desires early discharge home    LOS: 1 day   Araly Kaas J. 03/28/2016, 6:04 PM

## 2016-03-28 NOTE — Anesthesia Postprocedure Evaluation (Signed)
Anesthesia Post Note  Patient: Margaret Woods  Procedure(s) Performed: * No procedures listed *  Patient location during evaluation: Mother Baby Anesthesia Type: Epidural Level of consciousness: awake Pain management: pain level controlled Vital Signs Assessment: post-procedure vital signs reviewed and stable Respiratory status: spontaneous breathing Cardiovascular status: stable Postop Assessment: no headache, no backache, epidural receding and patient able to bend at knees Anesthetic complications: no        Last Vitals:  Vitals:   03/28/16 0025 03/28/16 0649  BP: 109/60 116/71  Pulse: 70 (!) 56  Resp: 18 18  Temp: 37.3 C 36.6 C    Last Pain:  Vitals:   03/28/16 0731  TempSrc:   PainSc: 2    Pain Goal:                 Edison PaceWILKERSON,Damica Gravlin

## 2016-03-28 NOTE — Discharge Summary (Signed)
OB Discharge Summary     Patient Name: Margaret Woods DOB: 10/14/1985 MRN: [id number]  Date of admission: 03/27/2016 Delivering MD: Margaret Woods, Margaret Woods   Date of discharge: 03/28/2016  Admitting diagnosis: INDUCTION Intrauterine pregnancy: 277w1d     Secondary diagnosis:  Active Problems:   Pregnancy   Vaginal delivery   Postpartum hemorrhage  Additional problems: None     Discharge diagnosis: Term Pregnancy Delivered and PPH                                                                                                Post partum procedures:NA  Augmentation: Pitocin  Complications: Hemorrhage>104800mL  Hospital course:  Induction of Labor With Vaginal Delivery   31 y.o. yo G2P2001 at 6377w1d was admitted to the hospital 03/27/2016 for induction of labor.  Indication for induction: Favorable cervix at term.  Patient had an uncomplicated labor course as follows: Membrane Rupture Time/Date: 11:18 AM ,03/27/2016   Intrapartum Procedures: Episiotomy: None [1]                                         Lacerations:  Periurethral [8]  Patient had delivery of a Viable infant.  Information for the patient's newborn:  Margaret Woods, Margaret Woods [[id number]][[id number]]  Delivery Method: Vaginal, Spontaneous Delivery (Filed from Delivery Summary)   03/27/2016  Details of delivery can be found in separate delivery note.  Patient had a routine postpartum course. Patient is discharged home 03/28/16.  Physical exam  Vitals:   03/27/16 2020 03/27/16 2236 03/28/16 0025 03/28/16 0649  BP: 136/68 (!) 115/57 109/60 116/71  Pulse: 64 69 70 (!) 56  Resp: 18 18 18 18   Temp: 98.4 F (36.9 C) 98.7 F (37.1 C) 99.2 F (37.3 C) 97.9 F (36.6 C)  TempSrc: Oral Oral Oral   SpO2:      Weight:      Height:       General: alert, cooperative and no distress Lochia: appropriate Uterine Fundus: firm Incision: N/A DVT Evaluation: No evidence of DVT seen on physical exam. Labs: Lab Results  Component Value Date   WBC  14.2 (H) 03/28/2016   HGB 12.6 03/28/2016   HCT 36.4 03/28/2016   MCV 90.5 03/28/2016   PLT 106 (L) 03/28/2016   CMP Latest Ref Rng & Units 09/24/2014  Glucose 65 - 99 mg/dL 086(V165(H)  BUN 6 - 20 mg/dL 5(L)  Creatinine 7.840.44 - 1.00 mg/dL 6.960.73  Sodium 295135 - 284145 mmol/L 139  Potassium 3.5 - 5.1 mmol/L 4.4  Chloride 101 - 111 mmol/L 106  CO2 22 - 32 mmol/L 25  Calcium 8.9 - 10.3 mg/dL 9.1  Total Protein 6.5 - 8.1 g/dL -  Total Bilirubin 0.3 - 1.2 mg/dL -  Alkaline Phos 38 - 132126 U/L -  AST 15 - 41 U/L -  ALT 14 - 54 U/L -    Discharge instruction: per After Visit Summary and "Baby and Me Booklet".  After visit meds:  Allergies as of 03/28/2016  Reactions   Vancomycin Other (See Comments)   Can take with benadryl @ a slower rate   Vicodin [hydrocodone-acetaminophen] Other (See Comments)   "It makes me paranoid and angry"      Medication List    TAKE these medications   ibuprofen 600 MG tablet Commonly known as:  ADVIL,MOTRIN Take 1 tablet (600 mg total) by mouth every 6 (six) hours as needed.   prenatal multivitamin Tabs tablet Take 1 tablet by mouth daily at 12 noon.       Diet: routine diet  Activity: Advance as tolerated. Pelvic rest for 6 weeks.   Outpatient follow up:6 weeks Follow up Appt:No future appointments. Follow up Visit:No Follow-up on file.  Postpartum contraception: IUD Mirena  Newborn Data: Live born female  Birth Weight: 8 lb 11 oz (3941 g) APGAR: 9, 9  Baby Feeding: Breast Disposition:home with mother   03/28/2016 Margaret Woods., MD

## 2016-06-15 DIAGNOSIS — Z30430 Encounter for insertion of intrauterine contraceptive device: Secondary | ICD-10-CM | POA: Diagnosis not present

## 2016-09-05 ENCOUNTER — Other Ambulatory Visit (HOSPITAL_COMMUNITY)
Admission: RE | Admit: 2016-09-05 | Discharge: 2016-09-05 | Disposition: A | Discharge status: Home / Self Care | Payer: BLUE CROSS/BLUE SHIELD | Source: Ambulatory Visit | Attending: Obstetrics and Gynecology | Admitting: Obstetrics and Gynecology

## 2016-09-05 ENCOUNTER — Other Ambulatory Visit: Payer: Self-pay | Admitting: Obstetrics and Gynecology

## 2016-09-05 DIAGNOSIS — Z01419 Encounter for gynecological examination (general) (routine) without abnormal findings: Secondary | ICD-10-CM | POA: Diagnosis not present

## 2016-09-05 DIAGNOSIS — Z124 Encounter for screening for malignant neoplasm of cervix: Secondary | ICD-10-CM | POA: Diagnosis not present

## 2016-09-05 DIAGNOSIS — Z30431 Encounter for routine checking of intrauterine contraceptive device: Secondary | ICD-10-CM | POA: Diagnosis not present

## 2016-09-07 LAB — CYTOLOGY - PAP
Diagnosis: NEGATIVE
HPV (WINDOPATH): NOT DETECTED

## 2016-11-30 DIAGNOSIS — L02415 Cutaneous abscess of right lower limb: Secondary | ICD-10-CM | POA: Diagnosis not present

## 2017-02-04 DIAGNOSIS — J029 Acute pharyngitis, unspecified: Secondary | ICD-10-CM | POA: Diagnosis not present

## 2017-02-04 DIAGNOSIS — Z20818 Contact with and (suspected) exposure to other bacterial communicable diseases: Secondary | ICD-10-CM | POA: Diagnosis not present

## 2017-06-22 DIAGNOSIS — J069 Acute upper respiratory infection, unspecified: Secondary | ICD-10-CM | POA: Diagnosis not present

## 2017-06-22 DIAGNOSIS — R509 Fever, unspecified: Secondary | ICD-10-CM | POA: Diagnosis not present

## 2017-06-22 DIAGNOSIS — J029 Acute pharyngitis, unspecified: Secondary | ICD-10-CM | POA: Diagnosis not present

## 2017-06-22 DIAGNOSIS — R6889 Other general symptoms and signs: Secondary | ICD-10-CM | POA: Diagnosis not present

## 2017-07-06 DIAGNOSIS — L0231 Cutaneous abscess of buttock: Secondary | ICD-10-CM | POA: Diagnosis not present

## 2018-03-07 ENCOUNTER — Encounter: Payer: Self-pay | Admitting: Podiatry

## 2018-03-07 ENCOUNTER — Ambulatory Visit (INDEPENDENT_AMBULATORY_CARE_PROVIDER_SITE_OTHER): Payer: BLUE CROSS/BLUE SHIELD | Admitting: Podiatry

## 2018-03-07 DIAGNOSIS — L608 Other nail disorders: Secondary | ICD-10-CM | POA: Diagnosis not present

## 2018-03-07 DIAGNOSIS — L603 Nail dystrophy: Secondary | ICD-10-CM

## 2018-03-07 NOTE — Progress Notes (Signed)
  Subjective:  Patient ID: Margaret Woods, female    DOB: 1985-06-04,  MRN: [id number] HPI Chief Complaint  Patient presents with  . Nail Problem    Hallux nail left - injury to nail 2 years ago, nail cracked over time nail has become discolored, 2nd nails are starting to get thick and discolored-no treatment  . New Patient (Initial Visit)    33 y.o. female presents with the above complaint.   ROS: Denies fever chills nausea vomiting muscle aches pains calf pain back pain chest pain shortness of breath.  Past Medical History:  Diagnosis Date  . Depression    medication in past   Past Surgical History:  Procedure Laterality Date  . KNEE SURGERY    . MANDIBLE FRACTURE SURGERY    . NOSE SURGERY     No current outpatient medications on file.  Allergies  Allergen Reactions  . Vancomycin Other (See Comments)    Can take with benadryl @ a slower rate   Review of Systems Objective:  There were no vitals filed for this visit.  General: Well developed, nourished, in no acute distress, alert and oriented x3   Dermatological: Skin is warm, dry and supple bilateral. Nails x 10 are well maintained; remaining integument appears unremarkable at this time. There are no open sores, no preulcerative lesions, no rash or signs of infection present.  Thickened discolored nail that does not grow hallux left.  Second toe similarly.  Vascular: Dorsalis Pedis artery and Posterior Tibial artery pedal pulses are 2/4 bilateral with immedate capillary fill time. Pedal hair growth present. No varicosities and no lower extremity edema present bilateral.   Neruologic: Grossly intact via light touch bilateral. Vibratory intact via tuning fork bilateral. Protective threshold with Semmes Wienstein monofilament intact to all pedal sites bilateral. Patellar and Achilles deep tendon reflexes 2+ bilateral. No Babinski or clonus noted bilateral.   Musculoskeletal: No gross boney pedal deformities bilateral. No  pain, crepitus, or limitation noted with foot and ankle range of motion bilateral. Muscular strength 5/5 in all groups tested bilateral.  Gait: Unassisted, Nonantalgic.    Radiographs:  No radiographs taken today  Assessment & Plan:   Assessment: Nail dystrophy cannot rule out onychomycosis.  Plan: Samples of the toenail hallux left and second toes bilaterally were taken today to be sent for pathologic evaluation we will notify her as to those results.  Expected to be nail dystrophy.     Donnie Panik T. [place], [place]

## 2018-03-26 ENCOUNTER — Telehealth: Payer: Self-pay | Admitting: *Deleted

## 2018-03-26 NOTE — Telephone Encounter (Signed)
-----   Message from Elinor Parkinson, [place] sent at 03/26/2018  7:12 AM EDT ----- Path shows only nail dystrophy.  Please inform the patient of this and cancel f/u for nails

## 2018-03-26 NOTE — Telephone Encounter (Signed)
Left message informing pt results were available to call to discuss.

## 2018-03-26 NOTE — Telephone Encounter (Signed)
I informed pt of Dr. Geryl Rankins review of results and that I would cancel her appt. Pt states understanding.

## 2018-04-04 ENCOUNTER — Ambulatory Visit: Payer: BLUE CROSS/BLUE SHIELD | Admitting: Podiatry

## 2018-07-18 ENCOUNTER — Encounter: Payer: Self-pay | Admitting: Podiatry

## 2018-07-18 ENCOUNTER — Other Ambulatory Visit: Payer: Self-pay

## 2018-07-18 ENCOUNTER — Ambulatory Visit (INDEPENDENT_AMBULATORY_CARE_PROVIDER_SITE_OTHER): Payer: BC Managed Care – PPO | Admitting: Podiatry

## 2018-07-18 VITALS — Temp 97.8°F

## 2018-07-18 DIAGNOSIS — L603 Nail dystrophy: Secondary | ICD-10-CM

## 2018-07-18 NOTE — Patient Instructions (Signed)

## 2018-07-18 NOTE — Progress Notes (Signed)
She presents today with a chief concern of a painful hallux nail left.  She says part of the nail is just fallen off and on like to have the rest of it removed.  Objective: Pulses are palpable.  A portion of the nail is retained proximally.  Appears to be a nail dystrophy test proved there was a nail dystrophy previously.  Assessment: Painful retention of a fragment of nail.  Plan: After local anesthetic was administered we performed a total nail avulsion temporary in nature hoping that the nail will grow back normal.  She will start soaking twice daily and I will follow-up with her in a couple of weeks.

## 2018-08-06 ENCOUNTER — Encounter: Payer: Self-pay | Admitting: Podiatry

## 2018-08-06 ENCOUNTER — Other Ambulatory Visit: Payer: Self-pay

## 2018-08-06 ENCOUNTER — Ambulatory Visit (INDEPENDENT_AMBULATORY_CARE_PROVIDER_SITE_OTHER): Payer: Self-pay | Admitting: Podiatry

## 2018-08-06 VITALS — Temp 98.2°F

## 2018-08-06 DIAGNOSIS — Z9889 Other specified postprocedural states: Secondary | ICD-10-CM

## 2018-08-06 DIAGNOSIS — L603 Nail dystrophy: Secondary | ICD-10-CM

## 2018-08-06 NOTE — Progress Notes (Signed)
She presents today for follow-up of her nail avulsion hallux left she denies fever chills nausea vomiting muscle aches and pains.  States that it seems to be doing very well.  Objective: This is completely dried up there is no signs of drainage no signs of infection the margins will go ahead and fill in before long and appears that she is doing very well.  Assessment: Well-healing surgical foot hallux left.  Plan: Follow-up with me on an as-needed basis.

## 2018-08-08 DIAGNOSIS — J029 Acute pharyngitis, unspecified: Secondary | ICD-10-CM | POA: Diagnosis not present

## 2018-10-09 DIAGNOSIS — T3695XA Adverse effect of unspecified systemic antibiotic, initial encounter: Secondary | ICD-10-CM | POA: Diagnosis not present

## 2018-10-09 DIAGNOSIS — L03317 Cellulitis of buttock: Secondary | ICD-10-CM | POA: Diagnosis not present

## 2018-10-09 DIAGNOSIS — B379 Candidiasis, unspecified: Secondary | ICD-10-CM | POA: Diagnosis not present

## 2018-10-11 DIAGNOSIS — L02416 Cutaneous abscess of left lower limb: Secondary | ICD-10-CM | POA: Diagnosis not present

## 2018-10-11 DIAGNOSIS — Z0189 Encounter for other specified special examinations: Secondary | ICD-10-CM | POA: Diagnosis not present

## 2018-10-14 DIAGNOSIS — L02416 Cutaneous abscess of left lower limb: Secondary | ICD-10-CM | POA: Diagnosis not present

## 2018-10-14 DIAGNOSIS — Z5189 Encounter for other specified aftercare: Secondary | ICD-10-CM | POA: Diagnosis not present

## 2019-01-17 DIAGNOSIS — E049 Nontoxic goiter, unspecified: Secondary | ICD-10-CM

## 2019-01-17 HISTORY — DX: Nontoxic goiter, unspecified: E04.9

## 2019-01-31 DIAGNOSIS — H16202 Unspecified keratoconjunctivitis, left eye: Secondary | ICD-10-CM | POA: Diagnosis not present

## 2019-02-04 DIAGNOSIS — H16203 Unspecified keratoconjunctivitis, bilateral: Secondary | ICD-10-CM | POA: Diagnosis not present

## 2019-04-14 DIAGNOSIS — Z13220 Encounter for screening for lipoid disorders: Secondary | ICD-10-CM | POA: Diagnosis not present

## 2019-04-14 DIAGNOSIS — E049 Nontoxic goiter, unspecified: Secondary | ICD-10-CM | POA: Diagnosis not present

## 2019-04-14 DIAGNOSIS — F411 Generalized anxiety disorder: Secondary | ICD-10-CM | POA: Diagnosis not present

## 2019-04-14 DIAGNOSIS — Z8249 Family history of ischemic heart disease and other diseases of the circulatory system: Secondary | ICD-10-CM | POA: Diagnosis not present

## 2019-04-14 DIAGNOSIS — Z0000 Encounter for general adult medical examination without abnormal findings: Secondary | ICD-10-CM | POA: Diagnosis not present

## 2019-04-17 ENCOUNTER — Other Ambulatory Visit: Payer: Self-pay | Admitting: Family Medicine

## 2019-04-17 DIAGNOSIS — E049 Nontoxic goiter, unspecified: Secondary | ICD-10-CM

## 2019-04-17 DIAGNOSIS — R590 Localized enlarged lymph nodes: Secondary | ICD-10-CM

## 2019-04-17 DIAGNOSIS — E041 Nontoxic single thyroid nodule: Secondary | ICD-10-CM

## 2019-04-25 ENCOUNTER — Other Ambulatory Visit: Payer: BLUE CROSS/BLUE SHIELD

## 2019-05-24 DIAGNOSIS — L02214 Cutaneous abscess of groin: Secondary | ICD-10-CM | POA: Diagnosis not present

## 2019-05-27 DIAGNOSIS — Z09 Encounter for follow-up examination after completed treatment for conditions other than malignant neoplasm: Secondary | ICD-10-CM | POA: Diagnosis not present

## 2020-01-12 DIAGNOSIS — Z20822 Contact with and (suspected) exposure to covid-19: Secondary | ICD-10-CM | POA: Diagnosis not present

## 2020-01-13 DIAGNOSIS — Z03818 Encounter for observation for suspected exposure to other biological agents ruled out: Secondary | ICD-10-CM | POA: Diagnosis not present

## 2020-01-13 DIAGNOSIS — R059 Cough, unspecified: Secondary | ICD-10-CM | POA: Diagnosis not present

## 2020-03-24 ENCOUNTER — Other Ambulatory Visit: Payer: Self-pay

## 2020-03-24 ENCOUNTER — Other Ambulatory Visit: Payer: Self-pay | Admitting: Family Medicine

## 2020-03-24 ENCOUNTER — Ambulatory Visit (INDEPENDENT_AMBULATORY_CARE_PROVIDER_SITE_OTHER): Payer: BC Managed Care – PPO

## 2020-03-24 DIAGNOSIS — E049 Nontoxic goiter, unspecified: Secondary | ICD-10-CM

## 2020-04-01 ENCOUNTER — Other Ambulatory Visit: Payer: Self-pay

## 2020-04-01 ENCOUNTER — Ambulatory Visit (INDEPENDENT_AMBULATORY_CARE_PROVIDER_SITE_OTHER): Payer: BC Managed Care – PPO | Admitting: Internal Medicine

## 2020-04-01 ENCOUNTER — Encounter: Payer: Self-pay | Admitting: Internal Medicine

## 2020-04-01 VITALS — BP 110/78 | HR 64 | Ht 65.0 in | Wt 141.8 lb

## 2020-04-01 DIAGNOSIS — E040 Nontoxic diffuse goiter: Secondary | ICD-10-CM

## 2020-04-01 DIAGNOSIS — E063 Autoimmune thyroiditis: Secondary | ICD-10-CM | POA: Diagnosis not present

## 2020-04-01 NOTE — Patient Instructions (Signed)
Please start Selenium 200 mcg daily.  Please come back for a follow-up appointment in 6 months.

## 2020-04-01 NOTE — Progress Notes (Signed)
Patient ID: Margaret Woods, female   DOB: [birth year], 35 y.o.   MRN: [id number]   This visit occurred during the SARS-CoV-2 public health emergency.  Safety protocols were in place, including screening questions prior to the visit, additional usage of staff PPE, and extensive cleaning of exam room while observing appropriate contact time as indicated for disinfecting solutions.   HPI  Margaret Woods is a 35 y.o.-year-old female, referred by Jarrett Soho, PA-C, for management of Hashimoto's thyroiditis and goiter.  Pt. has been dx with Hashimoto's thyroiditis hypothyroidism in 03/2020; she is not on Levothyroxine.  I reviewed pt's thyroid tests: 03/24/2020: TSH 0.67, ATA <1.0, TPO Abs 295 (0-34) No results found for: TSH, FREET4   She had thyroid ultrasounds performed on: 12/14/2010 02/08/2012 06/09/2014 And they all showed tiny thyroid nodules.  Most recent thyroid ultrasound (03/24/2020) showed: Mildly heterogeneous and enlarged thyroid without evidence for nodule  Pt denies - weight gain - fatigue - depression - constipation - dry skin - hair loss  But she complains of: - Watery eyes (allergies) - Sore throat - anxiety  - feeling nodules in neck - hoarseness - dysphagia, only some dysphagia with pills - choking - SOB with lying down  She has no FH of thyroid disorders. No FH of thyroid cancer. No FH of autoimmune ds. No h/o radiation tx to head or neck. No recent use of iodine supplements. No recent steroids.  Pt. also has a history of boils - Staph infection 2/2 spider bite >> sepsis 09/2014. Also, ADD, anxiety.  ROS: Constitutional: no weight gain/loss, no fatigue, + subjective hypothermia Eyes: no blurry vision, no xerophthalmia ENT: no sore throat, + see HPI Cardiovascular: no CP/SOB/palpitations/leg swelling Respiratory: no cough/SOB Gastrointestinal: no N/V/D/C Musculoskeletal: no muscle/joint aches Skin: no rashes Neurological: no  tremors/numbness/tingling/dizziness Psychiatric: no depression/+ anxiety  Past Medical History:  Diagnosis Date  . Depression    medication in past   Past Surgical History:  Procedure Laterality Date  . KNEE SURGERY    . MANDIBLE FRACTURE SURGERY    . NOSE SURGERY     Social History   Socioeconomic History  . Marital status: Married    Spouse name: Not on file  . Number of children: 2  . Years of education: Not on file  . Highest education level: Not on file  Occupational History  . Occupation: Nature conservation officer  Tobacco Use  . Smoking status: Light Tobacco Smoker  . Smokeless tobacco: Never Used  . Tobacco comment: socially  Substance and Sexual Activity  . Alcohol use: Yes    Comment: vodka, socially  . Drug use: No  . Sexual activity: Yes    Birth control/protection: I.U.D.  Other Topics Concern  . Not on file  Social History Narrative  . Not on file   Social Determinants of Health   Financial Resource Strain: Not on file  Food Insecurity: Not on file  Transportation Needs: Not on file  Physical Activity: Not on file  Stress: Not on file  Social Connections: Not on file  Intimate Partner Violence: Not on file   Current Outpatient Medications on File Prior to Visit  Medication Sig Dispense Refill  . erythromycin ophthalmic ointment      No current facility-administered medications on file prior to visit.   Allergies  Allergen Reactions  . Vancomycin Other (See Comments)    Can take with benadryl @ a slower rate   Family History  Problem Relation Age of Onset  . Hypertension  Father    PE: BP 110/78 (BP Location: Right Arm, Patient Position: Sitting, Cuff Size: Normal)   Pulse 64   Ht 5\' 5"  (1.651 m)   Wt 141 lb 12.8 oz (64.3 kg)   SpO2 98%   BMI 23.60 kg/m  Wt Readings from Last 3 Encounters:  04/01/20 141 lb 12.8 oz (64.3 kg)  03/27/16 169 lb (76.7 kg)  09/23/14 140 lb (63.5 kg)   Constitutional: normal weight, in NAD Eyes: PERRLA,  EOMI, no exophthalmos ENT: moist mucous membranes, + symmetric thyromegaly, no cervical lymphadenopathy Cardiovascular: RRR, No MRG Respiratory: CTA B Gastrointestinal: abdomen soft, NT, ND, BS+ Musculoskeletal: no deformities, strength intact in all 4 Skin: moist, warm, no rashes Neurological: no tremor with outstretched hands, DTR normal in all 4  ASSESSMENT: 1. Hashimoto thyroiditis  2. Goiter - non-nodular  PLAN: 1. Hashimoto thyroiditis - I reviewed the images of her 2022 thyroid ultrasound along with the patient.The thyroid gland appears slightly heterogeneous, with increased blood flow, consistent with her diagnosis of Hashimoto's thyroiditis.  Pt does not have a thyroid cancer family history or a personal history of RxTx to head/neck, so is not at high risk for [facility]. - we had a long discussion about her Hashimoto thyroiditis diagnosis. I explained that this is an autoimmune disorder, in which she develops antibodies against her own thyroid. The antibodies bind to the thyroid tissue and cause inflammation, and, eventually, destruction of the gland and hypothyroidism.  - I also explained that thyroid enlargement especially at the beginning of her Hashimoto thyroiditis course is not uncommon, and it has a waxing and waning character.  - We discussed about treatment for Hashimoto thyroiditis, which is actually limited to thyroid hormones in case her TFTs are abnormal. Supplements like selenium has been tried with various results, some showing improvement in the TPO antibodies. However, there are no randomized controlled trials of this are consistent results between trials. We also discussed about ways to improve her immune system (relaxation, diet, exercise, sleep) to reduce the Ab titer and, subsequently, the thyroid inflammation. - for now, we decided to start selenium 200 mcg daily and I will recheck her antibodies and TFTs at next visit. - I advised pt to join my chart - I will see  her in 6 months  2. Goiter -She denies neck compression symptoms, except for dysphagia with pills -She previously had repeated ultrasounds since 2016 which showed tiny thyroid nodules -Reviewed her latest thyroid ultrasound from 03/24/2020: Mildly heterogeneous and enlarged thyroid without evidence for distinct nodule -We discussed that the enlargement is most likely due to inflammation in the setting of Hashimoto's thyroiditis no imaging follow-up is needed for this -I advised her about taking ibuprofen or other NSAID if she has increased pressure in neck  05/24/2020, MD PhD [facility]

## 2020-04-09 ENCOUNTER — Encounter: Payer: Self-pay | Admitting: Internal Medicine

## 2020-06-23 DIAGNOSIS — F321 Major depressive disorder, single episode, moderate: Secondary | ICD-10-CM | POA: Diagnosis not present

## 2020-06-23 DIAGNOSIS — R454 Irritability and anger: Secondary | ICD-10-CM | POA: Diagnosis not present

## 2020-06-23 DIAGNOSIS — Z7689 Persons encountering health services in other specified circumstances: Secondary | ICD-10-CM | POA: Diagnosis not present

## 2020-06-23 DIAGNOSIS — F411 Generalized anxiety disorder: Secondary | ICD-10-CM | POA: Diagnosis not present

## 2020-08-09 DIAGNOSIS — R454 Irritability and anger: Secondary | ICD-10-CM | POA: Diagnosis not present

## 2020-08-09 DIAGNOSIS — F988 Other specified behavioral and emotional disorders with onset usually occurring in childhood and adolescence: Secondary | ICD-10-CM | POA: Diagnosis not present

## 2020-08-09 DIAGNOSIS — F321 Major depressive disorder, single episode, moderate: Secondary | ICD-10-CM | POA: Diagnosis not present

## 2020-08-09 DIAGNOSIS — Z23 Encounter for immunization: Secondary | ICD-10-CM | POA: Diagnosis not present

## 2020-08-09 DIAGNOSIS — F411 Generalized anxiety disorder: Secondary | ICD-10-CM | POA: Diagnosis not present

## 2020-08-11 ENCOUNTER — Other Ambulatory Visit: Payer: Self-pay

## 2020-08-11 ENCOUNTER — Ambulatory Visit (INDEPENDENT_AMBULATORY_CARE_PROVIDER_SITE_OTHER): Payer: BC Managed Care – PPO | Admitting: Podiatry

## 2020-08-11 DIAGNOSIS — L600 Ingrowing nail: Secondary | ICD-10-CM | POA: Diagnosis not present

## 2020-08-11 NOTE — Progress Notes (Signed)
   HPI: 35 y.o. female presenting today for a new complaint regarding an ingrown toenail with infection to the left great toenail plate.  Patient states that a few years ago she ended up having the toenail completely avulsed and a new nail growing in without any complications.  She does have history of trauma to the toenail and the toenail is somewhat dystrophic but it has been very manageable over the last few years.  Most recently she developed an infection and currently she states that she is on doxycycline 100 mg 2 times daily #20.  She presents for further treatment and evaluation  Past Medical History:  Diagnosis Date   Depression    medication in past     Physical Exam: General: The patient is alert and oriented x3 in no acute distress.  Dermatology: Skin is warm, dry and supple bilateral lower extremities. Negative for open lesions or macerations.  There is some mild erythema noted around the base of the nail and medially.  Consistent with findings of an ingrown toenail/paronychia  Vascular: Palpable pedal pulses bilaterally. No edema or erythema noted. Capillary refill within normal limits.  Neurological: Epicritic and protective threshold grossly intact bilaterally.   Musculoskeletal Exam: No pedal deformities noted   Assessment: 1.  Dystrophic nail with paronychia left hallux   Plan of Care:  1. Patient evaluated. 2.  Continue doxycycline 100 mg 2 times daily #20 3.  Patient states that she is going to the beach this weekend with her family.  She would like to wait to have any procedure performed.  She did very well with a total temporary nail avulsion of the hallux nail plate a few years ago.  She would like to have that performed again when she returns from the beach 4.  Return to clinic in 1 week for total temporary nail avulsion left hallux      Felecia Shelling, DPM [facility]  Dr. Felecia Shelling, DPM    [address] [place], [place] [address]                Office [phone]  Fax [phone]

## 2020-08-23 ENCOUNTER — Ambulatory Visit: Payer: BC Managed Care – PPO | Admitting: Podiatry

## 2020-09-09 DIAGNOSIS — F321 Major depressive disorder, single episode, moderate: Secondary | ICD-10-CM | POA: Diagnosis not present

## 2020-09-09 DIAGNOSIS — R454 Irritability and anger: Secondary | ICD-10-CM | POA: Diagnosis not present

## 2020-09-09 DIAGNOSIS — F411 Generalized anxiety disorder: Secondary | ICD-10-CM | POA: Diagnosis not present

## 2020-09-09 DIAGNOSIS — F988 Other specified behavioral and emotional disorders with onset usually occurring in childhood and adolescence: Secondary | ICD-10-CM | POA: Diagnosis not present

## 2020-09-10 DIAGNOSIS — L0231 Cutaneous abscess of buttock: Secondary | ICD-10-CM | POA: Diagnosis not present

## 2020-09-23 DIAGNOSIS — L0100 Impetigo, unspecified: Secondary | ICD-10-CM | POA: Diagnosis not present

## 2020-09-23 DIAGNOSIS — R531 Weakness: Secondary | ICD-10-CM | POA: Diagnosis not present

## 2020-09-23 DIAGNOSIS — L0231 Cutaneous abscess of buttock: Secondary | ICD-10-CM | POA: Diagnosis not present

## 2020-09-30 DIAGNOSIS — L814 Other melanin hyperpigmentation: Secondary | ICD-10-CM | POA: Diagnosis not present

## 2020-09-30 DIAGNOSIS — F411 Generalized anxiety disorder: Secondary | ICD-10-CM | POA: Diagnosis not present

## 2020-09-30 DIAGNOSIS — R454 Irritability and anger: Secondary | ICD-10-CM | POA: Diagnosis not present

## 2020-09-30 DIAGNOSIS — F321 Major depressive disorder, single episode, moderate: Secondary | ICD-10-CM | POA: Diagnosis not present

## 2020-09-30 DIAGNOSIS — F988 Other specified behavioral and emotional disorders with onset usually occurring in childhood and adolescence: Secondary | ICD-10-CM | POA: Diagnosis not present

## 2020-09-30 DIAGNOSIS — L720 Epidermal cyst: Secondary | ICD-10-CM | POA: Diagnosis not present

## 2020-10-01 DIAGNOSIS — L723 Sebaceous cyst: Secondary | ICD-10-CM | POA: Diagnosis not present

## 2020-10-04 ENCOUNTER — Ambulatory Visit: Payer: BC Managed Care – PPO | Admitting: Internal Medicine

## 2020-10-04 NOTE — Progress Notes (Deleted)
Patient ID: Margaret Woods, female   DOB: 14-Aug-1985, 35 y.o.   MRN: [id number]   This visit occurred during the SARS-CoV-2 public health emergency.  Safety protocols were in place, including screening questions prior to the visit, additional usage of staff PPE, and extensive cleaning of exam room while observing appropriate contact time as indicated for disinfecting solutions.   HPI  Margaret Woods is a 35 y.o.-year-old female, initially referred by Gweneth Dimitri, MD, returning for follow-up for Hashimoto's thyroiditis and goiter.  Last visit 6 months ago.  Interim history:  Reviewed history: Pt. has been dx with Hashimoto's thyroiditis hypothyroidism in 03/2020; she is not on Levothyroxine.  I reviewed pt's thyroid tests: 03/24/2020: TSH 0.67, ATA <1.0, TPO Abs 295 (0-34) No results found for: TSH, FREET4   She had thyroid ultrasounds performed on: 12/14/2010 02/08/2012 06/09/2014 And they all showed tiny thyroid nodules.  Most recent thyroid ultrasound (03/24/2020) showed: Mildly heterogeneous and enlarged thyroid without evidence for nodule  Pt denies - weight gain - fatigue - depression - constipation - dry skin - hair loss  She denies: - feeling nodules in neck - hoarseness - dysphagia, only some dysphagia with pills - choking - SOB with lying down  She has no FH of thyroid disorders. No FH of thyroid cancer. No FH of autoimmune ds. No h/o radiation tx to head or neck. No recent use of iodine supplements. No recent steroids.  Pt. also has a history of boils - Staph infection 2/2 spider bite >> sepsis 09/2014. Also, ADD-on Adderall, anxiety -on BuSpar as needed and Lexapro.  ROS: Constitutional: no weight gain/loss, no fatigue, + subjective hypothermia Eyes: no blurry vision, no xerophthalmia ENT: no sore throat, + see HPI Cardiovascular: no CP/SOB/palpitations/leg swelling Respiratory: no cough/SOB Gastrointestinal: no N/V/D/C Musculoskeletal: no  muscle/joint aches Skin: no rashes Neurological: no tremors/numbness/tingling/dizziness  Past Medical History:  Diagnosis Date   Depression    medication in past   Past Surgical History:  Procedure Laterality Date   KNEE SURGERY     MANDIBLE FRACTURE SURGERY     NOSE SURGERY     Social History   Socioeconomic History   Marital status: Married    Spouse name: Not on file   Number of children: 2   Years of education: Not on file   Highest education level: Not on file  Occupational History   Occupation: Nature conservation officer  Tobacco Use   Smoking status: Light Smoker   Smokeless tobacco: Never   Tobacco comments:    socially  Substance and Sexual Activity   Alcohol use: Yes    Comment: vodka, socially   Drug use: No   Sexual activity: Yes    Birth control/protection: I.U.D.  Other Topics Concern   Not on file  Social History Narrative   Not on file   Social Determinants of Health   Financial Resource Strain: Not on file  Food Insecurity: Not on file  Transportation Needs: Not on file  Physical Activity: Not on file  Stress: Not on file  Social Connections: Not on file  Intimate Partner Violence: Not on file   Current Outpatient Medications on File Prior to Visit  Medication Sig Dispense Refill   amphetamine-dextroamphetamine (ADDERALL XR) 10 MG 24 hr capsule Take by mouth.     escitalopram (LEXAPRO) 20 MG tablet Take by mouth.     IUD'S IU by Intrauterine route.     loratadine (CLARITIN) 10 MG tablet 1 tablet     No  current facility-administered medications on file prior to visit.   Allergies  Allergen Reactions   Vancomycin Other (See Comments)    Can take with benadryl @ a slower rate   Family History  Problem Relation Age of Onset   Hypertension Father    PE: There were no vitals taken for this visit. Wt Readings from Last 3 Encounters:  04/01/20 141 lb 12.8 oz (64.3 kg)  03/27/16 169 lb (76.7 kg)  09/23/14 140 lb (63.5 kg)   Constitutional:  normal weight, in NAD Eyes: PERRLA, EOMI, no exophthalmos ENT: moist mucous membranes, + symmetric thyromegaly, no cervical lymphadenopathy Cardiovascular: RRR, No MRG Respiratory: CTA B Gastrointestinal: abdomen soft, NT, ND, BS+ Musculoskeletal: no deformities, strength intact in all 4 Skin: moist, warm, no rashes Neurological: no tremor with outstretched hands, DTR normal in all 4  ASSESSMENT: 1. Hashimoto thyroiditis  2. Goiter - non-nodular  PLAN: 1. Hashimoto thyroiditis -Patient with a history of slightly heterogeneous appearance of her thyroid on ultrasound, with increased blood flow, consistent with diagnosis of Hashimoto's thyroiditis. -At last visit we had a long discussion about her Hashimoto's thyroiditis and explained that this is an autoimmune disorder which leads to hypothyroidism meantime. -However, at last visit, her TFTs were normal so we did not have to start levothyroxine -At last visit I advised her to start selenium 200 mcg daily to improve thyroid autoimmunity -At today's visit we will check her TFTs and also her thyroid antibodies - I will see her in 1 year  2. Goiter -She denies neck compression symptoms.  She does have chronic dysphagia with pills. -Repeated ultrasounds since 2016 only showed tiny thyroid nodules.  Latest ultrasound from 03/24/2020 showed a mildly heterogeneous and enlarged thyroid without evidence of distinct nodules. -We discussed that thyroid enlargement in her case is most likely due to inflammation in the setting of Hashimoto's thyroiditis and no imaging follow-up is needed -I did advise her about taking NSAIDs if she has increased pressure in the neck, but no other intervention is needed  Carlus Pavlov, MD PhD [facility] Endocrinology

## 2020-10-08 ENCOUNTER — Ambulatory Visit: Payer: Self-pay | Admitting: Surgery

## 2020-10-08 NOTE — Progress Notes (Signed)
Sent message, via epic in basket, requesting orders in epic from surgeon.  

## 2020-10-14 DIAGNOSIS — L723 Sebaceous cyst: Secondary | ICD-10-CM | POA: Diagnosis not present

## 2020-10-14 DIAGNOSIS — B379 Candidiasis, unspecified: Secondary | ICD-10-CM | POA: Diagnosis not present

## 2020-10-14 NOTE — Patient Instructions (Addendum)
DUE TO COVID-19 ONLY ONE VISITOR IS ALLOWED TO COME WITH YOU AND STAY IN THE WAITING ROOM ONLY DURING PRE OP AND PROCEDURE DAY OF SURGERY IF YOU ARE GOING HOME AFTER SURGERY. IF YOU ARE SPENDING THE NIGHT 2 PEOPLE MAY VISIT WITH YOU IN YOUR PRIVATE ROOM AFTER SURGERY UNTIL VISITING  HOURS ARE OVER AT 800 PM AND THE 2 VISITORS CANNOT SPEND THE NIGHT.                 Robie Ridge     Your procedure is scheduled on: 11/12/20   Report to [facility] Main  Entrance   Report to admitting at  1:45  PM     Call this number if you have problems the morning of surgery [phone]    Remember: Do not eat food  :After Midnight the night before your surgery,     You may have clear liquids from midnight until ---.1:00 PM    CLEAR LIQUID DIET   Foods Allowed                                                                     Foods Excluded  Coffee and tea, regular and decaf  No Milk or creamer                                                           liquids that you cannot  Plain Jell-O any favor except red or purple                                           see through such as: Fruit ices (not with fruit pulp)                                     milk, soups, orange juice  Iced Popsicles                                    All solid food Carbonated beverages, regular and diet                                    Cranberry, grape and apple juices Sports drinks like Gatorade Lightly seasoned clear broth or consume(fat free) Sugar    BRUSH YOUR TEETH MORNING OF SURGERY AND RINSE YOUR MOUTH OUT, NO CHEWING GUM CANDY OR MINTS.     Take these medicines the morning of surgery with A SIP OF WATER: lexapro  DO NOT TAKE ANY DIABETIC MEDICATIONS DAY OF YOUR SURGERY                               You may not have any  metal on your body including hair pins and              piercings  Do not wear jewelry, make-up, lotions, powders or perfumes, deodorant             Do not wear nail  polish on your finger or toe nails.  Do not shave  48 hours prior to surgery.              .   Do not bring valuables to the hospital. [facility] IS NOT             RESPONSIBLE   FOR VALUABLES.  Contacts, dentures or bridgework may not be worn into surgery.     Patients discharged the day of surgery will not be allowed to drive home. IF YOU ARE HAVING SURGERY AND GOING HOME THE SAME DAY, YOU MUST HAVE AN ADULT TO DRIVE YOU HOME AND BE WITH YOU FOR 24 HOURS. YOU MAY GO HOME BY TAXI OR UBER OR ORTHERWISE, BUT AN ADULT MUST ACCOMPANY YOU HOME AND STAY WITH YOU FOR 24 HOURS.  Name and phone number of your driver:  Special Instructions: N/A              Please read over the following fact sheets you were given: _____________________________________________________________________             [facility] - Preparing for Surgery Before surgery, you can play an important role.  Because skin is not sterile, your skin needs to be as free of germs as possible.  You can reduce the number of germs on your skin by washing with CHG (chlorahexidine gluconate) soap before surgery.  CHG is an antiseptic cleaner which kills germs and bonds with the skin to continue killing germs even after washing. Please DO NOT use if you have an allergy to CHG or antibacterial soaps.  If your skin becomes reddened/irritated stop using the CHG and inform your nurse when you arrive at Short Stay. Do not shave (including legs and underarms) for at least 48 hours prior to the first CHG shower.   Please follow these instructions carefully:  1.  Shower with CHG Soap the night before surgery and the  morning of Surgery.  2.  If you choose to wash your hair, wash your hair first as usual with your  normal  shampoo.  3.  After you shampoo, rinse your hair and body thoroughly to remove the  shampoo.                            4.  Use CHG as you would any other liquid soap.  You can apply chg directly  to the skin and wash                        Gently with a scrungie or clean washcloth.  5.  Apply the CHG Soap to your body ONLY FROM THE NECK DOWN.   Do not use on face/ open                           Wound or open sores. Avoid contact with eyes, ears mouth and genitals (private parts).                       Wash face,  Genitals (private parts) with your normal soap.  6.  Wash thoroughly, paying special attention to the area where your surgery  will be performed.  7.  Thoroughly rinse your body with warm water from the neck down.  8.  DO NOT shower/wash with your normal soap after using and rinsing off  the CHG Soap.             9.  Pat yourself dry with a clean towel.            10.  Wear clean pajamas.            11.  Place clean sheets on your bed the night of your first shower and do not  sleep with pets. Day of Surgery : Do not apply any lotions/deodorants the morning of surgery.  Please wear clean clothes to the hospital/surgery center.  FAILURE TO FOLLOW THESE INSTRUCTIONS MAY RESULT IN THE CANCELLATION OF YOUR SURGERY PATIENT SIGNATURE_________________________________  NURSE SIGNATURE__________________________________  ________________________________________________________________________

## 2020-10-15 ENCOUNTER — Other Ambulatory Visit: Payer: Self-pay

## 2020-10-15 ENCOUNTER — Encounter (HOSPITAL_COMMUNITY)
Admission: RE | Admit: 2020-10-15 | Discharge: 2020-10-15 | Disposition: A | Discharge status: Home / Self Care | Payer: BC Managed Care – PPO | Source: Ambulatory Visit | Attending: Surgery | Admitting: Surgery

## 2020-10-15 ENCOUNTER — Encounter (HOSPITAL_COMMUNITY): Payer: Self-pay | Admitting: *Deleted

## 2020-10-15 DIAGNOSIS — Z01812 Encounter for preprocedural laboratory examination: Secondary | ICD-10-CM | POA: Insufficient documentation

## 2020-10-15 LAB — CBC
HCT: 42.3 % (ref 36.0–46.0)
Hemoglobin: 14.1 g/dL (ref 12.0–15.0)
MCH: 31.8 pg (ref 26.0–34.0)
MCHC: 33.3 g/dL (ref 30.0–36.0)
MCV: 95.3 fL (ref 80.0–100.0)
Platelets: 178 10*3/uL (ref 150–400)
RBC: 4.44 MIL/uL (ref 3.87–5.11)
RDW: 12.3 % (ref 11.5–15.5)
WBC: 6.3 10*3/uL (ref 4.0–10.5)
nRBC: 0 % (ref 0.0–0.2)

## 2020-10-15 NOTE — Progress Notes (Signed)
COVID test- NA  PCP - Venia Minks NP Cardiologist - none  Chest x-ray - no EKG - no Stress Test - no ECHO - no Cardiac Cath - no Pacemaker/ICD device last checked:NA  Sleep Study - no CPAP -   Fasting Blood Sugar - NA Checks Blood Sugar _____ times a day  Blood Thinner Instructions:NA Aspirin Instructions: Last Dose:  Anesthesia review: no  Patient denies shortness of breath, fever, cough and chest pain at PAT appointment Pt is in good shape. No SOB with any activities  Patient verbalized understanding of instructions that were given to them at the PAT appointment. Patient was also instructed that they will need to review over the PAT instructions again at home before surgery. yes

## 2020-11-11 DIAGNOSIS — L0100 Impetigo, unspecified: Secondary | ICD-10-CM | POA: Diagnosis not present

## 2020-11-12 ENCOUNTER — Ambulatory Visit (HOSPITAL_COMMUNITY): Admission: RE | Admit: 2020-11-12 | Payer: BC Managed Care – PPO | Source: Ambulatory Visit | Admitting: Surgery

## 2020-11-12 ENCOUNTER — Encounter (HOSPITAL_COMMUNITY): Admission: RE | Payer: Self-pay | Source: Ambulatory Visit

## 2020-11-12 SURGERY — EXCISION, KELOID
Anesthesia: Monitor Anesthesia Care | Laterality: Left

## 2020-11-17 ENCOUNTER — Other Ambulatory Visit: Payer: Self-pay

## 2020-11-17 ENCOUNTER — Ambulatory Visit (INDEPENDENT_AMBULATORY_CARE_PROVIDER_SITE_OTHER): Payer: BC Managed Care – PPO | Admitting: Internal Medicine

## 2020-11-17 ENCOUNTER — Telehealth: Payer: Self-pay

## 2020-11-17 VITALS — BP 115/74 | HR 66 | Temp 98.4°F | Resp 16 | Ht 65.0 in | Wt 143.0 lb

## 2020-11-17 DIAGNOSIS — L0390 Cellulitis, unspecified: Secondary | ICD-10-CM | POA: Diagnosis not present

## 2020-11-17 DIAGNOSIS — L309 Dermatitis, unspecified: Secondary | ICD-10-CM | POA: Diagnosis not present

## 2020-11-17 MED ORDER — LINEZOLID 600 MG PO TABS: 600.0000 mg | ORAL_TABLET | Freq: Two times a day (BID) | ORAL | 0 refills | Status: DC

## 2020-11-17 MED ORDER — TRIAMCINOLONE ACETONIDE 0.025 % EX OINT: 1.0000 "application " | TOPICAL_OINTMENT | Freq: Two times a day (BID) | CUTANEOUS | 0 refills | Status: DC

## 2020-11-17 MED ORDER — TRIAMCINOLONE ACETONIDE 0.025 % EX OINT: 1.0000 "application " | TOPICAL_OINTMENT | Freq: Two times a day (BID) | CUTANEOUS | 0 refills | Status: AC

## 2020-11-17 NOTE — Telephone Encounter (Signed)
I apologize  I called the pharmacy and it should be ready  Thanks

## 2020-11-17 NOTE — Patient Instructions (Signed)
I agree you likely have recurrent staph aureus skin cellulitis/abscess.  The current small carbuncle if it gets larger, red/painful, please start linezolid. In the mean time can warm compress it  With regard the the skin changes at the cystic area, this is dermatitis from topical antibiotics. Avoid topical antibiotics/fungal cream at this time. Please start trial of steroid cream twice a day for the next 5-7 days  We can do video visit in around 7-10 days and see how things are    Thank you

## 2020-11-17 NOTE — Telephone Encounter (Signed)
[facility] pharmacy called to request days supply for triamcinolone cream in order to fill the prescription. Will route to provider.   Sandie Ano, RN

## 2020-11-17 NOTE — Progress Notes (Signed)
[facility]  Reason for Consult:recurrent skin infection Referring Provider: Venia Minks NP    Patient Active Problem List   Diagnosis Date Noted   Vaginal delivery 03/28/2016   Postpartum hemorrhage 03/28/2016   Pregnancy 03/27/2016   Rash and nonspecific skin eruption 12/22/2014   Cellulitis and abscess of leg 09/23/2014   Thrombocytopenia (HCC) 09/23/2014      HPI: Margaret Woods is a 35 y.o. female Hashimoto's thyroiditis, thyroid goiter, referred by pcp for recurrent skin infection  I reviewed epic chart She was seen by dr Orvan Falconer in 2016 for mssa abscess right leg  From [facility] documents: Patient has been seeing general surgery for a sebaceous cyst right buttock. Per chart it has been drained several times and she has had superimposed infection and received empiric abx.  Her scheduled excision is delayed due to the concern of ongoing superficial cellulitis   She reports hx of staph aureus infection various area of her body but nothing consistently along the distribution of hidradenitis syndrome  She saw dermatology previously who put her on decolonization procedure  She has been on abx which currently is augmentin for concern of recurrent right buttock cyst infection. She was given topical antifungal and antibiotics too, which has made the area worse.   No f/c No n/v/diarrhea  Most recently there is a developing carbuncle below the cyst on the right buttock  She asked if there is any blood test to check to see why she has recurrent cellutitis   Review of Systems: ROS All other ros negative      Past Medical History:  Diagnosis Date   Depression    medication in past   Goiter 2021    Social History   Tobacco Use   Smoking status: Every Day    Packs/day: 0.10    Years: 15.00    Pack years: 1.50    Types: Cigarettes   Smokeless tobacco: Never   Tobacco comments:    socially  Vaping Use    Vaping Use: Never used  Substance Use Topics   Alcohol use: Yes    Comment: vodka, socially   Drug use: No    Family History  Problem Relation Age of Onset   Hypertension Father     Allergies  Allergen Reactions   Vancomycin Other (See Comments)    Can take with benadryl @ a slower rate    OBJECTIVE: Vitals:   11/17/20 0909  BP: 115/74  Pulse: 66  Resp: 16  Temp: 98.4 F (36.9 C)  TempSrc: Temporal  SpO2: 100%  Weight: 143 lb (64.9 kg)  Height: 5\' 5"  (1.651 m)   Body mass index is 23.8 kg/m.   Physical Exam General/constitutional: no distress, pleasant HEENT: Normocephalic, PER, Conj Clear, EOMI, Oropharynx clear Neck supple CV: rrr no mrg Lungs: clear to auscultation, normal respiratory effort Abd: Soft, Nontender Ext: no edema Skin: right buttock are 2 lesions; 1 plaque like slightly erythematous, moderate hyperkeratosis nontender; a smaller <1inch indurated tender lesion below that with slight opening in the center but not much erythema Neuro: nonfocal MSK: no peripheral joint swelling/tenderness/warmth; back spines nontender Psych alert/oriented   Lab: Lab Results  Component Value Date   WBC 6.3 10/15/2020   HGB 14.1 10/15/2020   HCT 42.3 10/15/2020   MCV 95.3 10/15/2020   PLT 178 10/15/2020   Last metabolic panel Lab Results  Component Value Date   GLUCOSE 165 (H) 09/24/2014  NA 139 09/24/2014   K 4.4 09/24/2014   CL 106 09/24/2014   CO2 25 09/24/2014   BUN 5 (L) 09/24/2014   CREATININE 0.73 09/24/2014   GFRNONAA >60 09/24/2014   CALCIUM 9.1 09/24/2014   PROT 8.0 09/23/2014   ALBUMIN 4.1 09/23/2014   BILITOT 0.8 09/23/2014   ALKPHOS 54 09/23/2014   AST 25 09/23/2014   ALT 21 09/23/2014   ANIONGAP 8 09/24/2014    Microbiology:  Serology:  Imaging:   Assessment/plan: Problem List Items Addressed This Visit   None Visit Diagnoses     Recurrent cellulitis    -  Primary   Dermatitis          Considered hidradenitis but  distribution and hx so far not supporting Dermatitis due to topical abx on buttock area over the sebaceous cyst There is a developing carbuncle just below it No other lesions today's 11/17/20 visit  She has tried decolonization procedure including hibiclens and topical nasal abx before. Discussed with her this is not a proven process for community populations  We consider chronic doxycycline suppression if this becomes recurrent  Agree the cyst will need to be excised to prevent regrowth    -7 to 10 days video visit -stop augmentin -topical [place] to dermatitis -linezolid po if carbuncle gets worse -avoid topical abx       Follow-up: No follow-ups on file.  Raymondo Band, MD [facility] [phone] pager   [phone] cell 11/17/2020, 9:26 AM

## 2020-11-18 ENCOUNTER — Telehealth: Payer: Self-pay

## 2020-11-18 NOTE — Telephone Encounter (Signed)
Pharmacy contacted office to state that new prescription for Linezolid are contraindicated with her Adderall and Lexapro. RN will forward to provider and pharmacy team to determine if alternative abx treatment can be used.   Helem Reesor Loyola Mast, RN

## 2020-11-18 NOTE — Telephone Encounter (Signed)
Dr. Renold Don, I do think linezolid is of course fine for 2 weeks with the Lexapro, but the Adderall does concern me even for a short course of linezolid given the risk of hypertensive morbidities/fatalities. Would you consider doxycycline or Bactrim for her to use if her carbuncle worsens? Happy to send in script on your behalf. Thanks!  Margarite Gouge, PharmD, CPP Clinical Pharmacist Practitioner Infectious Diseases Clinical Pharmacist [facility]

## 2020-11-18 NOTE — Telephone Encounter (Signed)
Thanks [employer] do doxy then

## 2020-11-19 ENCOUNTER — Other Ambulatory Visit: Payer: Self-pay | Admitting: Pharmacist

## 2020-11-19 DIAGNOSIS — L0390 Cellulitis, unspecified: Secondary | ICD-10-CM

## 2020-11-19 MED ORDER — DOXYCYCLINE HYCLATE 100 MG PO TABS: 100.0000 mg | ORAL_TABLET | Freq: Two times a day (BID) | ORAL | 0 refills | Status: AC

## 2020-11-19 MED ORDER — DOXYCYCLINE HYCLATE 100 MG PO TABS: 100.0000 mg | ORAL_TABLET | Freq: Two times a day (BID) | ORAL | 0 refills | Status: DC

## 2020-11-19 NOTE — Telephone Encounter (Signed)
Sent in doxy script and LVM to discuss with patient - thank you!

## 2020-11-19 NOTE — Telephone Encounter (Signed)
FYI - LVM with her to call office back. Sent in doxy x 14 days to [facility] pharmacy per Dr. Renold Don. Recommend to at least try doxycycline until she sees Dr. Renold Don next week. Would you be able to chat with her if she calls the clinic back? Thanks!

## 2020-11-19 NOTE — Telephone Encounter (Signed)
Spoke with patient this morning, and she states she has taken doxycycline before and failed it. She states her infection has grown and worsened since Wednesday and still has not heard from surgery about any debridement details. She asked about IV options. Dalvance would certainly be an expanded option if she has failed doxycycline before. Would you be interested in trialing a one-time dose of dalbavancin at short stay? If so, I can work with Lupita Leash to see about coverage. Thanks, Marchelle Folks

## 2020-11-19 NOTE — Telephone Encounter (Signed)
They were treating the wrong thing. She has contact dermatitis from topical abx. Which i have rxed prednisone   The oral abx is for the carbuncle if it gets worse  Lets stick with plan. She should see [place] again this coming week

## 2020-11-19 NOTE — Telephone Encounter (Signed)
Thank you. Lets just follow up. Chatting takes too long and our staffs already spent more time chatting than the visit

## 2020-11-24 NOTE — Progress Notes (Addendum)
Anesthesia Review:  PCP: Selinda Michaels  Cardiologist : none  Chest x-ray : EKG : Echo : Stress test: Cardiac Cath :  Activity level: can do a flgiht of stairs without difficulty  Sleep Study/ CPAP : none  Fasting Blood Sugar :      / Checks Blood Sugar -- times a day:   Blood Thinner/ Instructions /Last Dose: ASA / Instructions/ Last Dose :   No covid test- ambulatory surgery

## 2020-11-24 NOTE — Progress Notes (Signed)
Your procedure is scheduled on:    11/30/2020   Report to [facility] Main  Entrance   Report to admitting at   145pm    Call this number if you have problems the morning of surgery [phone]    REMEMBER: NO  SOLID FOOD CANDY OR GUM AFTER MIDNIGHT. CLEAR LIQUIDS UNTIL   100pm      . NOTHING BY MOUTH EXCEPT CLEAR LIQUIDS UNTIL  100pm  . PLEASE FINISH ENSURE DRINK PER SURGEON ORDER  WHICH NEEDS TO BE COMPLETED AT    100pm  .      CLEAR LIQUID DIET   Foods Allowed                                                                    Coffee and tea, regular and decaf                            Fruit ices (not with fruit pulp)                                      Iced Popsicles                                    Carbonated beverages, regular and diet                                    Cranberry, grape and apple juices Sports drinks like Gatorade Lightly seasoned clear broth or consume(fat free) Sugar, honey syrup ___________________________________________________________________      BRUSH YOUR TEETH MORNING OF SURGERY AND RINSE YOUR MOUTH OUT, NO CHEWING GUM CANDY OR MINTS.     Take these medicines the morning of surgery with A SIP OF WATER:   leapro, claritin if needed   DO NOT TAKE ANY DIABETIC MEDICATIONS DAY OF YOUR SURGERY                               You may not have any metal on your body including hair pins and              piercings  Do not wear jewelry, make-up, lotions, powders or perfumes, deodorant             Do not wear nail polish on your fingernails.  Do not shave  48 hours prior to surgery.              Men may shave face and neck.   Do not bring valuables to the hospital. [place] IS NOT             RESPONSIBLE   FOR VALUABLES.  Contacts, dentures or bridgework may not be worn into surgery.  Leave suitcase in the car. After surgery it may be brought to your room.     Patients discharged the day of surgery will not be allowed to drive  home.  IF YOU ARE HAVING SURGERY AND GOING HOME THE SAME DAY, YOU MUST HAVE AN ADULT TO DRIVE YOU HOME AND BE WITH YOU FOR 24 HOURS. YOU MAY GO HOME BY TAXI OR UBER OR ORTHERWISE, BUT AN ADULT MUST ACCOMPANY YOU HOME AND STAY WITH YOU FOR 24 HOURS.  Name and phone number of your driver:  Special Instructions: N/A              Please read over the following fact sheets you were given: _____________________________________________________________________  [facility] - Preparing for Surgery Before surgery, you can play an important role.  Because skin is not sterile, your skin needs to be as free of germs as possible.  You can reduce the number of germs on your skin by washing with CHG (chlorahexidine gluconate) soap before surgery.  CHG is an antiseptic cleaner which kills germs and bonds with the skin to continue killing germs even after washing. Please DO NOT use if you have an allergy to CHG or antibacterial soaps.  If your skin becomes reddened/irritated stop using the CHG and inform your nurse when you arrive at Short Stay. Do not shave (including legs and underarms) for at least 48 hours prior to the first CHG shower.  You may shave your face/neck. Please follow these instructions carefully:  1.  Shower with CHG Soap the night before surgery and the  morning of Surgery.  2.  If you choose to wash your hair, wash your hair first as usual with your  normal  shampoo.  3.  After you shampoo, rinse your hair and body thoroughly to remove the  shampoo.                           4.  Use CHG as you would any other liquid soap.  You can apply chg directly  to the skin and wash                       Gently with a scrungie or clean washcloth.  5.  Apply the CHG Soap to your body ONLY FROM THE NECK DOWN.   Do not use on face/ open                           Wound or open sores. Avoid contact with eyes, ears mouth and genitals (private parts).                       Wash face,  Genitals (private parts) with  your normal soap.             6.  Wash thoroughly, paying special attention to the area where your surgery  will be performed.  7.  Thoroughly rinse your body with warm water from the neck down.  8.  DO NOT shower/wash with your normal soap after using and rinsing off  the CHG Soap.                9.  Pat yourself dry with a clean towel.            10.  Wear clean pajamas.            11.  Place clean sheets on your bed the night of your first shower and do not  sleep with pets. Day of Surgery : Do not apply any lotions/deodorants the morning of surgery.  Please wear clean clothes to the hospital/surgery center.  FAILURE TO FOLLOW THESE INSTRUCTIONS MAY RESULT IN THE CANCELLATION OF YOUR SURGERY PATIENT SIGNATURE_________________________________  NURSE SIGNATURE__________________________________  ________________________________________________________________________

## 2020-11-25 ENCOUNTER — Encounter (HOSPITAL_COMMUNITY)
Admission: RE | Admit: 2020-11-25 | Discharge: 2020-11-25 | Disposition: A | Discharge status: Home / Self Care | Payer: BC Managed Care – PPO | Source: Ambulatory Visit | Attending: Surgery | Admitting: Surgery

## 2020-11-25 ENCOUNTER — Other Ambulatory Visit: Payer: Self-pay

## 2020-11-25 ENCOUNTER — Ambulatory Visit (INDEPENDENT_AMBULATORY_CARE_PROVIDER_SITE_OTHER): Payer: BC Managed Care – PPO | Admitting: Internal Medicine

## 2020-11-25 ENCOUNTER — Encounter (HOSPITAL_COMMUNITY): Payer: Self-pay

## 2020-11-25 VITALS — BP 108/73 | HR 62 | Temp 98.2°F | Resp 16 | Ht 65.0 in | Wt 140.0 lb

## 2020-11-25 DIAGNOSIS — D696 Thrombocytopenia, unspecified: Secondary | ICD-10-CM

## 2020-11-25 DIAGNOSIS — L089 Local infection of the skin and subcutaneous tissue, unspecified: Secondary | ICD-10-CM

## 2020-11-25 DIAGNOSIS — Z01812 Encounter for preprocedural laboratory examination: Secondary | ICD-10-CM | POA: Diagnosis not present

## 2020-11-25 DIAGNOSIS — Z01818 Encounter for other preprocedural examination: Secondary | ICD-10-CM

## 2020-11-25 DIAGNOSIS — L729 Follicular cyst of the skin and subcutaneous tissue, unspecified: Secondary | ICD-10-CM

## 2020-11-25 HISTORY — DX: Other specified postprocedural states: Z98.890

## 2020-11-25 HISTORY — DX: Other specified postprocedural states: R11.2

## 2020-11-25 HISTORY — DX: Anxiety disorder, unspecified: F41.9

## 2020-11-25 HISTORY — DX: Family history of other specified conditions: Z84.89

## 2020-11-25 LAB — CBC
HCT: 41.3 % (ref 36.0–46.0)
Hemoglobin: 13.9 g/dL (ref 12.0–15.0)
MCH: 32.2 pg (ref 26.0–34.0)
MCHC: 33.7 g/dL (ref 30.0–36.0)
MCV: 95.6 fL (ref 80.0–100.0)
Platelets: 203 10*3/uL (ref 150–400)
RBC: 4.32 MIL/uL (ref 3.87–5.11)
RDW: 11.9 % (ref 11.5–15.5)
WBC: 7.4 10*3/uL (ref 4.0–10.5)
nRBC: 0 % (ref 0.0–0.2)

## 2020-11-30 ENCOUNTER — Ambulatory Visit (HOSPITAL_COMMUNITY): Payer: BC Managed Care – PPO | Admitting: Certified Registered Nurse Anesthetist

## 2020-11-30 ENCOUNTER — Ambulatory Visit (HOSPITAL_COMMUNITY)
Admission: RE | Admit: 2020-11-30 | Discharge: 2020-11-30 | Disposition: A | Discharge status: Home / Self Care | Payer: BC Managed Care – PPO | Source: Ambulatory Visit | Attending: Surgery | Admitting: Surgery

## 2020-11-30 ENCOUNTER — Encounter (HOSPITAL_COMMUNITY)
Admission: RE | Disposition: A | Discharge status: Home / Self Care | Payer: Self-pay | Source: Ambulatory Visit | Attending: Surgery

## 2020-11-30 ENCOUNTER — Encounter (HOSPITAL_COMMUNITY): Payer: Self-pay | Admitting: Surgery

## 2020-11-30 DIAGNOSIS — F17200 Nicotine dependence, unspecified, uncomplicated: Secondary | ICD-10-CM | POA: Insufficient documentation

## 2020-11-30 DIAGNOSIS — F418 Other specified anxiety disorders: Secondary | ICD-10-CM | POA: Diagnosis not present

## 2020-11-30 DIAGNOSIS — Z01818 Encounter for other preprocedural examination: Secondary | ICD-10-CM

## 2020-11-30 DIAGNOSIS — D696 Thrombocytopenia, unspecified: Secondary | ICD-10-CM | POA: Diagnosis not present

## 2020-11-30 DIAGNOSIS — L03119 Cellulitis of unspecified part of limb: Secondary | ICD-10-CM | POA: Diagnosis not present

## 2020-11-30 DIAGNOSIS — L723 Sebaceous cyst: Secondary | ICD-10-CM | POA: Insufficient documentation

## 2020-11-30 HISTORY — PX: CYST EXCISION: SHX5701

## 2020-11-30 LAB — PREGNANCY, URINE: Preg Test, Ur: NEGATIVE

## 2020-11-30 SURGERY — CYST REMOVAL
Anesthesia: Monitor Anesthesia Care | Laterality: Right

## 2020-11-30 MED ORDER — MIDAZOLAM HCL 5 MG/5ML IJ SOLN
INTRAMUSCULAR | Status: DC | PRN
  Administered 2020-11-30: 2 mg via INTRAVENOUS

## 2020-11-30 MED ORDER — BACITRACIN ZINC 500 UNIT/GM EX OINT
TOPICAL_OINTMENT | CUTANEOUS | Status: DC | PRN
  Administered 2020-11-30: 1 via TOPICAL

## 2020-11-30 MED ORDER — PROPOFOL 500 MG/50ML IV EMUL
INTRAVENOUS | Status: DC | PRN
  Administered 2020-11-30: 100 ug/kg/min via INTRAVENOUS

## 2020-11-30 MED ORDER — FENTANYL CITRATE (PF) 100 MCG/2ML IJ SOLN
INTRAMUSCULAR | Status: AC
  Filled 2020-11-30: qty 2

## 2020-11-30 MED ORDER — CHLORHEXIDINE GLUCONATE 0.12 % MT SOLN
15.0000 mL | Freq: Once | OROMUCOSAL | Status: AC
  Administered 2020-11-30: 15 mL via OROMUCOSAL

## 2020-11-30 MED ORDER — ORAL CARE MOUTH RINSE: 15.0000 mL | Freq: Once | OROMUCOSAL | Status: AC

## 2020-11-30 MED ORDER — KETOROLAC TROMETHAMINE 15 MG/ML IJ SOLN: 15.0000 mg | INTRAMUSCULAR | Status: DC

## 2020-11-30 MED ORDER — CHLORHEXIDINE GLUCONATE CLOTH 2 % EX PADS: 6.0000 | MEDICATED_PAD | Freq: Once | CUTANEOUS | Status: DC

## 2020-11-30 MED ORDER — PROPOFOL 1000 MG/100ML IV EMUL
INTRAVENOUS | Status: AC
  Filled 2020-11-30: qty 100

## 2020-11-30 MED ORDER — PROPOFOL 10 MG/ML IV BOLUS
INTRAVENOUS | Status: DC | PRN
  Administered 2020-11-30 (×3): 30 mg via INTRAVENOUS

## 2020-11-30 MED ORDER — SCOPOLAMINE 1 MG/3DAYS TD PT72
1.0000 | MEDICATED_PATCH | TRANSDERMAL | Status: DC
  Administered 2020-11-30: 1.5 mg via TRANSDERMAL
  Filled 2020-11-30: qty 1

## 2020-11-30 MED ORDER — LACTATED RINGERS IV SOLN: INTRAVENOUS | Status: DC

## 2020-11-30 MED ORDER — 0.9 % SODIUM CHLORIDE (POUR BTL) OPTIME
TOPICAL | Status: DC | PRN
  Administered 2020-11-30: 1000 mL

## 2020-11-30 MED ORDER — ONDANSETRON HCL 4 MG/2ML IJ SOLN
INTRAMUSCULAR | Status: AC
  Filled 2020-11-30: qty 2

## 2020-11-30 MED ORDER — FENTANYL CITRATE (PF) 100 MCG/2ML IJ SOLN
INTRAMUSCULAR | Status: DC | PRN
  Administered 2020-11-30: 50 ug via INTRAVENOUS
  Administered 2020-11-30 (×2): 25 ug via INTRAVENOUS

## 2020-11-30 MED ORDER — BUPIVACAINE-EPINEPHRINE (PF) 0.25% -1:200000 IJ SOLN
INTRAMUSCULAR | Status: AC
  Filled 2020-11-30: qty 30

## 2020-11-30 MED ORDER — BACITRACIN ZINC 500 UNIT/GM EX OINT
TOPICAL_OINTMENT | CUTANEOUS | Status: AC
  Filled 2020-11-30: qty 28.35

## 2020-11-30 MED ORDER — DIPHENHYDRAMINE HCL 50 MG/ML IJ SOLN
INTRAMUSCULAR | Status: AC
  Filled 2020-11-30: qty 1

## 2020-11-30 MED ORDER — DIPHENHYDRAMINE HCL 50 MG/ML IJ SOLN
12.5000 mg | Freq: Four times a day (QID) | INTRAMUSCULAR | Status: DC | PRN
  Administered 2020-11-30: 12.5 mg via INTRAVENOUS

## 2020-11-30 MED ORDER — FENTANYL CITRATE PF 50 MCG/ML IJ SOSY
PREFILLED_SYRINGE | INTRAMUSCULAR | Status: AC
  Filled 2020-11-30: qty 2

## 2020-11-30 MED ORDER — APREPITANT 40 MG PO CAPS
40.0000 mg | ORAL_CAPSULE | Freq: Once | ORAL | Status: AC
  Administered 2020-11-30: 40 mg via ORAL
  Filled 2020-11-30: qty 1

## 2020-11-30 MED ORDER — MIDAZOLAM HCL 2 MG/2ML IJ SOLN
INTRAMUSCULAR | Status: AC
  Filled 2020-11-30: qty 2

## 2020-11-30 MED ORDER — DEXAMETHASONE SODIUM PHOSPHATE 10 MG/ML IJ SOLN
INTRAMUSCULAR | Status: AC
  Filled 2020-11-30: qty 1

## 2020-11-30 MED ORDER — BUPIVACAINE-EPINEPHRINE 0.25% -1:200000 IJ SOLN
INTRAMUSCULAR | Status: DC | PRN
  Administered 2020-11-30: 30 mL

## 2020-11-30 MED ORDER — ACETAMINOPHEN 500 MG PO TABS
1000.0000 mg | ORAL_TABLET | ORAL | Status: AC
  Administered 2020-11-30: 1000 mg via ORAL
  Filled 2020-11-30: qty 2

## 2020-11-30 MED ORDER — FENTANYL CITRATE PF 50 MCG/ML IJ SOSY
25.0000 ug | PREFILLED_SYRINGE | INTRAMUSCULAR | Status: DC | PRN
  Administered 2020-11-30 (×2): 50 ug via INTRAVENOUS

## 2020-11-30 MED ORDER — AMISULPRIDE (ANTIEMETIC) 5 MG/2ML IV SOLN: 10.0000 mg | Freq: Once | INTRAVENOUS | Status: DC | PRN

## 2020-11-30 MED ORDER — LIDOCAINE 2% (20 MG/ML) 5 ML SYRINGE
INTRAMUSCULAR | Status: DC | PRN
Start: 2020-11-30 — End: 2020-11-30
  Administered 2020-11-30: 60 mg via INTRAVENOUS

## 2020-11-30 MED ORDER — CEFAZOLIN SODIUM-DEXTROSE 2-4 GM/100ML-% IV SOLN
2.0000 g | INTRAVENOUS | Status: AC
  Administered 2020-11-30: 2 g via INTRAVENOUS
  Filled 2020-11-30: qty 100

## 2020-11-30 MED ORDER — OXYCODONE-ACETAMINOPHEN 5-325 MG PO TABS: 1.0000 | ORAL_TABLET | ORAL | 0 refills | Status: AC | PRN

## 2020-11-30 MED ORDER — ACETAMINOPHEN 10 MG/ML IV SOLN: 1000.0000 mg | Freq: Once | INTRAVENOUS | Status: DC | PRN

## 2020-11-30 MED ORDER — ONDANSETRON HCL 4 MG/2ML IJ SOLN
INTRAMUSCULAR | Status: DC | PRN
  Administered 2020-11-30: 4 mg via INTRAVENOUS

## 2020-11-30 MED ORDER — DEXAMETHASONE SODIUM PHOSPHATE 10 MG/ML IJ SOLN
INTRAMUSCULAR | Status: DC | PRN
  Administered 2020-11-30: 10 mg via INTRAVENOUS

## 2020-11-30 MED ORDER — LIDOCAINE HCL (PF) 2 % IJ SOLN
INTRAMUSCULAR | Status: AC
  Filled 2020-11-30: qty 5

## 2020-11-30 SURGICAL SUPPLY — 38 items
BAG COUNTER SPONGE SURGICOUNT (BAG) IMPLANT
BAG SPNG CNTER NS LX DISP (BAG)
BAG SURGICOUNT SPONGE COUNTING (BAG)
BLADE SURG 15 STRL LF DISP TIS (BLADE) ×1 IMPLANT
BLADE SURG 15 STRL SS (BLADE) ×3
BNDG GAUZE ELAST 4 BULKY (GAUZE/BANDAGES/DRESSINGS) IMPLANT
COVER MAYO STAND STRL (DRAPES) ×3 IMPLANT
COVER SURGICAL LIGHT HANDLE (MISCELLANEOUS) ×3 IMPLANT
DECANTER SPIKE VIAL GLASS SM (MISCELLANEOUS) IMPLANT
DRAIN PENROSE 0.5X18 (DRAIN) IMPLANT
DRSG PAD ABDOMINAL 8X10 ST (GAUZE/BANDAGES/DRESSINGS) IMPLANT
ELECT REM PT RETURN 15FT ADLT (MISCELLANEOUS) ×3 IMPLANT
GAUZE SPONGE 4X4 12PLY STRL (GAUZE/BANDAGES/DRESSINGS) ×2 IMPLANT
GLOVE SURG ENC TEXT LTX SZ8 (GLOVE) ×3 IMPLANT
GLOVE SURG POLY ORTHO LF SZ7.5 (GLOVE) ×3 IMPLANT
GLOVE SURG POLYISO LF SZ6.5 (GLOVE) ×2 IMPLANT
GLOVE SURG UNDER POLY LF SZ6.5 (GLOVE) ×3 IMPLANT
GOWN STRL REUS W/ TWL LRG LVL3 (GOWN DISPOSABLE) IMPLANT
GOWN STRL REUS W/TWL LRG LVL3 (GOWN DISPOSABLE) ×3
GOWN STRL REUS W/TWL XL LVL3 (GOWN DISPOSABLE) ×3 IMPLANT
KIT BASIN OR (CUSTOM PROCEDURE TRAY) ×3 IMPLANT
KIT TURNOVER KIT A (KITS) IMPLANT
NEEDLE HYPO 22GX1.5 SAFETY (NEEDLE) ×3 IMPLANT
NS IRRIG 1000ML POUR BTL (IV SOLUTION) ×3 IMPLANT
PACK LITHOTOMY IV (CUSTOM PROCEDURE TRAY) ×3 IMPLANT
PENCIL SMOKE EVACUATOR (MISCELLANEOUS) ×2 IMPLANT
SPONGE T-LAP 18X18 ~~LOC~~+RFID (SPONGE) ×3 IMPLANT
SUT ETHILON 2 0 PS N (SUTURE) ×6 IMPLANT
SUT ETHILON 3 0 PS 1 (SUTURE) IMPLANT
SUT VIC AB 3-0 SH 27 (SUTURE)
SUT VIC AB 3-0 SH 27XBRD (SUTURE) IMPLANT
SWAB COLLECTION DEVICE MRSA (MISCELLANEOUS) IMPLANT
SWAB CULTURE ESWAB REG 1ML (MISCELLANEOUS) IMPLANT
SYR BULB IRRIG 60ML STRL (SYRINGE) ×3 IMPLANT
SYR CONTROL 10ML LL (SYRINGE) ×3 IMPLANT
TAPE CLOTH 4X10 WHT NS (GAUZE/BANDAGES/DRESSINGS) ×3 IMPLANT
TOWEL OR 17X26 10 PK STRL BLUE (TOWEL DISPOSABLE) ×3 IMPLANT
TOWEL OR NON WOVEN STRL DISP B (DISPOSABLE) ×3 IMPLANT

## 2020-11-30 NOTE — Transfer of Care (Signed)
Immediate Anesthesia Transfer of Care Note  Patient: Kendrick Fries  Procedure(s) Performed: EXCISION OF RT BUTTOCK SEBACEOUS CYST (Right)  Patient Location: PACU  Anesthesia Type:MAC  Level of Consciousness: awake, alert  and oriented  Airway & Oxygen Therapy: Patient Spontanous Breathing and Patient connected to face mask oxygen  Post-op Assessment: Report given to RN and Post -op Vital signs reviewed and stable  Post vital signs: Reviewed and stable  Last Vitals:  Vitals Value Taken Time  BP 119/77 11/30/20 1636  Temp    Pulse 67 11/30/20 1638  Resp 18 11/30/20 1638  SpO2 100 % 11/30/20 1638  Vitals shown include unvalidated device data.  Last Pain:  Vitals:   11/30/20 1421  TempSrc:   PainSc: 2          Complications: No notable events documented.

## 2020-11-30 NOTE — Anesthesia Preprocedure Evaluation (Signed)
Anesthesia Evaluation  Patient identified by MRN, date of birth, ID band Patient awake    Reviewed: Allergy & Precautions, NPO status , Patient's Chart, lab work & pertinent test results  History of Anesthesia Complications (+) PONV  Airway Mallampati: II  TM Distance: >3 FB     Dental  (+) Teeth Intact   Pulmonary neg pulmonary ROS, Current Smoker,    Pulmonary exam normal        Cardiovascular negative cardio ROS   Rhythm:Regular Rate:Normal     Neuro/Psych Anxiety Depression negative neurological ROS     GI/Hepatic negative GI ROS, Neg liver ROS,   Endo/Other  negative endocrine ROS  Renal/GU negative Renal ROS  negative genitourinary   Musculoskeletal Buttock sebaceous cyst   Abdominal Normal abdominal exam  (+)   Peds  (+) ADHD Hematology negative hematology ROS (+)   Anesthesia Other Findings   Reproductive/Obstetrics                             Anesthesia Physical Anesthesia Plan  ASA: 2  Anesthesia Plan: MAC   Post-op Pain Management:    Induction: Intravenous  PONV Risk Score and Plan: 2 and Ondansetron, Dexamethasone, Propofol infusion, TIVA, Midazolam, Scopolamine patch - Pre-op and Aprepitant  Airway Management Planned: Simple Face Mask, Natural Airway and Nasal Cannula  Additional Equipment: None  Intra-op Plan:   Post-operative Plan:   Informed Consent: I have reviewed the patients History and Physical, chart, labs and discussed the procedure including the risks, benefits and alternatives for the proposed anesthesia with the patient or authorized representative who has indicated his/her understanding and acceptance.     Dental advisory given  Plan Discussed with: CRNA  Anesthesia Plan Comments:         Anesthesia Quick Evaluation

## 2020-11-30 NOTE — H&P (Signed)
Admitting Physician: Hyman Hopes Toneisha Savary  Service: General surgery  CC: right leg cysts  Subjective   HPI: Margaret Woods is an 35 y.o. female who is here for excision of right leg cysts  Past Medical History:  Diagnosis Date   Anxiety    Depression    medication in past   Family history of adverse reaction to anesthesia    father- renal failure - happened x 2 after surgery   Goiter 2021   PONV (postoperative nausea and vomiting)     Past Surgical History:  Procedure Laterality Date   KNEE SURGERY Right 2003   scope   MANDIBLE FRACTURE SURGERY     age 57   NOSE SURGERY     age 41    Family History  Problem Relation Age of Onset   Hypertension Father     Social:  reports that she has been smoking cigarettes. She has a 3.75 pack-year smoking history. She has never used smokeless tobacco. She reports current alcohol use. She reports that she does not use drugs.  Allergies:  Allergies  Allergen Reactions   Vancomycin Other (See Comments)    Can take with benadryl @ a slower rate    Medications: Current Outpatient Medications  Medication Instructions   amphetamine-dextroamphetamine (ADDERALL XR) 15 MG 24 hr capsule 15 mg, Oral, Daily   doxycycline (VIBRA-TABS) 100 mg, Oral, 2 times daily   erythromycin ophthalmic ointment 1 application, Both Eyes, Daily PRN   escitalopram (LEXAPRO) 20 mg, Oral, Daily   levonorgestrel (MIRENA) 20 MCG/DAY IUD 1 each, Intrauterine,  Once   loratadine (CLARITIN) 10 mg, Oral, Daily PRN   triamcinolone (KENALOG) 0.025 % ointment 1 application, Topical, 2 times daily    ROS - all of the below systems have been reviewed with the patient and positives are indicated with bold text General: chills, fever or night sweats Eyes: blurry vision or double vision ENT: epistaxis or sore throat Allergy/Immunology: itchy/watery eyes or nasal congestion Hematologic/Lymphatic: bleeding problems, blood clots or swollen lymph  nodes Endocrine: temperature intolerance or unexpected weight changes Breast: new or changing breast lumps or nipple discharge Resp: cough, shortness of breath, or wheezing CV: chest pain or dyspnea on exertion GI: as per HPI GU: dysuria, trouble voiding, or hematuria MSK: joint pain or joint stiffness Neuro: TIA or stroke symptoms Derm: pruritus and skin lesion changes Psych: anxiety and depression  Objective   PE Blood pressure 117/81, pulse 80, temperature 98.6 F (37 C), temperature source Oral, resp. rate 16, height 5\' 5"  (1.651 m), weight 63.5 kg, SpO2 99 %. Constitutional: NAD; conversant; no deformities Eyes: Moist conjunctiva; no lid lag; anicteric; PERRL Neck: Trachea midline; no thyromegaly Lungs: Normal respiratory effort; no tactile fremitus CV: RRR; no palpable thrills; no pitting edema GI: Abd soft, non-tender; no palpable hepatosplenomegaly MSK: Two inflamed right leg cysts, Normal range of motion of extremities; no clubbing/cyanosis Psychiatric: Appropriate affect; alert and oriented x3 Lymphatic: No palpable cervical or axillary lymphadenopathy  Results for orders placed or performed during the hospital encounter of 11/30/20 (from the past 24 hour(s))  Pregnancy, urine     Status: None   Collection Time: 11/30/20  2:04 PM  Result Value Ref Range   Preg Test, Ur NEGATIVE NEGATIVE    Imaging Orders  No imaging studies ordered today     Assessment and Plan   Margaret Woods is an 35 y.o. female with two inflamed right leg cysts here for excision.  Risks, benefits and  alternatives discussed and patient granted consent to proceed.  Will proceed as scheduled.   Felicie Morn, MD  [facility] Use [email or web address] to contact on call provider

## 2020-11-30 NOTE — Anesthesia Postprocedure Evaluation (Signed)
Anesthesia Post Note  Patient: Margaret Woods  Procedure(s) Performed: EXCISION OF RT BUTTOCK SEBACEOUS CYST (Right)     Anesthesia Post Evaluation No notable events documented.  Last Vitals:  Vitals:   11/30/20 1745 11/30/20 1754  BP: (!) 101/52 104/81  Pulse: (!) 55 60  Resp: 14 16  Temp: 36.6 C 36.7 C  SpO2: 93% 100%    Last Pain:  Vitals:   11/30/20 1754  TempSrc:   PainSc: 0-No pain                 Lewie Loron

## 2020-11-30 NOTE — Op Note (Signed)
   Patient: [name] ([birth year], [id number])  Date of Surgery: 11/30/2020   Preoperative Diagnosis: RIGHT BUTTOCK SEBACEOUS CYST   Postoperative Diagnosis: RIGHT BUTTOCK SEBACEOUS CYST   Surgical Procedure: EXCISION OF RT BUTTOCK SEBACEOUS CYST:    Operative Team Members:  Surgeon(s) and Role:    * Lily Velasquez, Hyman Hopes, MD - Primary   Anesthesiologist: Lewie Loron, MD CRNA: Kizzie Fantasia, CRNA; Williford, Peggy D, CRNA   Anesthesia: Monitor Anesthesia Care   Fluids:  Total I/O In: 800 [I.V.:800] Out: -   Complications: None  Drains:  none   Specimen:  ID Type Source Tests Collected by Time Destination  1 : right leg cyst Tissue PATH Soft tissue SURGICAL PATHOLOGY Devarious Pavek, Hyman Hopes, MD 11/30/2020 1606   2 : lower right leg cyst Tissue PATH Soft tissue SURGICAL PATHOLOGY Anasia Agro, Hyman Hopes, MD 11/30/2020 1607   A : right leg cyst fluid Body Fluid PATH Cytology Misc. fluid AEROBIC/ANAEROBIC CULTURE W GRAM STAIN (SURGICAL/DEEP WOUND) Alsie Younes, Hyman Hopes, MD 11/30/2020 1602      Disposition:  PACU - hemodynamically stable.  Plan of Care: Discharge to home after PACU    Indications for Procedure: Margaret Woods is a 35 y.o. female who presented with two infected right leg cysts. These were drained I recommended excision.  The procedure itself as well as its risks, benefits and alternatives were discussed.  The risks discussed included but were not limited to the risk of infection, bleeding, damage to nearby structures, and recurrent cysts.  After a full discussion and all questions answered the patient granted consent to proceed.  Findings:  Inflamed subcutaneous cysts superior: 2cm x 2 cm x 2cm and inferior: 2cm x 2cm x 2cm   Description of Procedure:   On the date stated above, the patient was placed in left lateral position and the right thigh was prepped and draped in sterile fashion.  Local was injected.  Elliptical incision was made  around the superior cyst.  The cyst was removed in total.  It was sent off as a specimen. The wound was closed using running vertical mattress 2-0 nylon suture.  The cyst was measured (2cm x 2cm x 2cm)  Local was injected.  Elliptical incision was made around the superior cyst.  The cyst was removed in total.  It was sent off as a specimen. The wound was closed using running simple 2-0 nylon suture.  The cyst was measured (2cm x 2cm x 2cm)  Antibiotic ointment was applied, sterile dressing was applied.   At the end of the case we reviewed the infection status of the case. Patient: Private Patient Elective Case Case: Elective Infection Present At Time Of Surgery (PATOS): Infection of the removed cysts  Ivar Drape, MD General, Bariatric, & Minimally Invasive Surgery [facility], [place]

## 2020-12-01 ENCOUNTER — Encounter (HOSPITAL_COMMUNITY): Payer: Self-pay | Admitting: Surgery

## 2020-12-02 LAB — SURGICAL PATHOLOGY

## 2020-12-06 LAB — AEROBIC/ANAEROBIC CULTURE W GRAM STAIN (SURGICAL/DEEP WOUND): Culture: NO GROWTH

## 2021-07-20 DIAGNOSIS — J029 Acute pharyngitis, unspecified: Secondary | ICD-10-CM | POA: Diagnosis not present

## 2021-07-20 DIAGNOSIS — F909 Attention-deficit hyperactivity disorder, unspecified type: Secondary | ICD-10-CM | POA: Diagnosis not present

## 2021-07-20 DIAGNOSIS — H6690 Otitis media, unspecified, unspecified ear: Secondary | ICD-10-CM | POA: Diagnosis not present

## 2021-07-20 DIAGNOSIS — F419 Anxiety disorder, unspecified: Secondary | ICD-10-CM | POA: Diagnosis not present

## 2021-10-18 DIAGNOSIS — H10403 Unspecified chronic conjunctivitis, bilateral: Secondary | ICD-10-CM | POA: Diagnosis not present

## 2021-10-25 DIAGNOSIS — H10403 Unspecified chronic conjunctivitis, bilateral: Secondary | ICD-10-CM | POA: Diagnosis not present

## 2021-12-26 DIAGNOSIS — F988 Other specified behavioral and emotional disorders with onset usually occurring in childhood and adolescence: Secondary | ICD-10-CM | POA: Diagnosis not present

## 2021-12-26 DIAGNOSIS — Z79899 Other long term (current) drug therapy: Secondary | ICD-10-CM | POA: Diagnosis not present

## 2021-12-26 DIAGNOSIS — R454 Irritability and anger: Secondary | ICD-10-CM | POA: Diagnosis not present

## 2021-12-26 DIAGNOSIS — F419 Anxiety disorder, unspecified: Secondary | ICD-10-CM | POA: Diagnosis not present

## 2022-02-06 DIAGNOSIS — J020 Streptococcal pharyngitis: Secondary | ICD-10-CM | POA: Diagnosis not present

## 2022-02-06 DIAGNOSIS — R6889 Other general symptoms and signs: Secondary | ICD-10-CM | POA: Diagnosis not present

## 2022-02-06 DIAGNOSIS — F419 Anxiety disorder, unspecified: Secondary | ICD-10-CM | POA: Diagnosis not present

## 2022-02-06 DIAGNOSIS — J029 Acute pharyngitis, unspecified: Secondary | ICD-10-CM | POA: Diagnosis not present

## 2022-08-03 DIAGNOSIS — F988 Other specified behavioral and emotional disorders with onset usually occurring in childhood and adolescence: Secondary | ICD-10-CM | POA: Diagnosis not present

## 2022-08-03 DIAGNOSIS — F411 Generalized anxiety disorder: Secondary | ICD-10-CM | POA: Diagnosis not present

## 2022-09-27 DIAGNOSIS — F411 Generalized anxiety disorder: Secondary | ICD-10-CM | POA: Diagnosis not present

## 2022-12-27 DIAGNOSIS — Z13220 Encounter for screening for lipoid disorders: Secondary | ICD-10-CM | POA: Diagnosis not present

## 2022-12-27 DIAGNOSIS — Z0000 Encounter for general adult medical examination without abnormal findings: Secondary | ICD-10-CM | POA: Diagnosis not present

## 2022-12-27 DIAGNOSIS — E049 Nontoxic goiter, unspecified: Secondary | ICD-10-CM | POA: Diagnosis not present

## 2022-12-27 DIAGNOSIS — Z1329 Encounter for screening for other suspected endocrine disorder: Secondary | ICD-10-CM | POA: Diagnosis not present

## 2023-01-10 NOTE — Progress Notes (Signed)
No show

## 2023-02-13 ENCOUNTER — Other Ambulatory Visit: Payer: Self-pay | Admitting: Nurse Practitioner

## 2023-02-13 DIAGNOSIS — R21 Rash and other nonspecific skin eruption: Secondary | ICD-10-CM | POA: Diagnosis not present

## 2023-02-13 DIAGNOSIS — E063 Autoimmune thyroiditis: Secondary | ICD-10-CM | POA: Diagnosis not present

## 2023-02-16 ENCOUNTER — Other Ambulatory Visit: Payer: BC Managed Care – PPO

## 2023-02-21 ENCOUNTER — Ambulatory Visit
Admission: RE | Admit: 2023-02-21 | Discharge: 2023-02-21 | Disposition: A | Discharge status: Home / Self Care | Payer: BC Managed Care – PPO | Source: Ambulatory Visit | Attending: Nurse Practitioner | Admitting: Nurse Practitioner

## 2023-02-21 DIAGNOSIS — E063 Autoimmune thyroiditis: Secondary | ICD-10-CM

## 2023-03-06 DIAGNOSIS — E049 Nontoxic goiter, unspecified: Secondary | ICD-10-CM | POA: Diagnosis not present

## 2023-03-06 DIAGNOSIS — E063 Autoimmune thyroiditis: Secondary | ICD-10-CM | POA: Diagnosis not present

## 2023-06-25 DIAGNOSIS — B880 Other acariasis: Secondary | ICD-10-CM | POA: Diagnosis not present

## 2023-06-25 DIAGNOSIS — H10403 Unspecified chronic conjunctivitis, bilateral: Secondary | ICD-10-CM | POA: Diagnosis not present

## 2023-06-25 DIAGNOSIS — H01001 Unspecified blepharitis right upper eyelid: Secondary | ICD-10-CM | POA: Diagnosis not present

## 2023-06-25 DIAGNOSIS — H01004 Unspecified blepharitis left upper eyelid: Secondary | ICD-10-CM | POA: Diagnosis not present

## 2023-11-06 DIAGNOSIS — R300 Dysuria: Secondary | ICD-10-CM | POA: Diagnosis not present

## 2023-11-06 DIAGNOSIS — N390 Urinary tract infection, site not specified: Secondary | ICD-10-CM | POA: Diagnosis not present

## 2023-11-28 DIAGNOSIS — N898 Other specified noninflammatory disorders of vagina: Secondary | ICD-10-CM | POA: Diagnosis not present

## 2023-11-28 DIAGNOSIS — Z1151 Encounter for screening for human papillomavirus (HPV): Secondary | ICD-10-CM | POA: Diagnosis not present

## 2023-11-28 DIAGNOSIS — Z01419 Encounter for gynecological examination (general) (routine) without abnormal findings: Secondary | ICD-10-CM | POA: Diagnosis not present

## 2023-11-28 DIAGNOSIS — Z124 Encounter for screening for malignant neoplasm of cervix: Secondary | ICD-10-CM | POA: Diagnosis not present

## 2023-11-28 DIAGNOSIS — Z113 Encounter for screening for infections with a predominantly sexual mode of transmission: Secondary | ICD-10-CM | POA: Diagnosis not present

## 2023-11-28 DIAGNOSIS — Z30432 Encounter for removal of intrauterine contraceptive device: Secondary | ICD-10-CM | POA: Diagnosis not present
